# Patient Record
Sex: Female | Born: 1965 | Race: Black or African American | Hispanic: No | Marital: Married | Smoking: Never smoker
Health system: Southern US, Community
[De-identification: ages and names within clinical notes are randomized; demographics above are authoritative.]

## PROBLEM LIST (undated history)

## (undated) ENCOUNTER — Emergency Department (HOSPITAL_BASED_OUTPATIENT_CLINIC_OR_DEPARTMENT_OTHER): Admission: EM | Payer: Managed Care, Other (non HMO) | Source: Home / Self Care

## (undated) ENCOUNTER — Emergency Department (HOSPITAL_COMMUNITY): Admission: EM | Payer: Managed Care, Other (non HMO) | Source: Home / Self Care

## (undated) DIAGNOSIS — F419 Anxiety disorder, unspecified: Secondary | ICD-10-CM

## (undated) DIAGNOSIS — K219 Gastro-esophageal reflux disease without esophagitis: Secondary | ICD-10-CM

## (undated) DIAGNOSIS — I10 Essential (primary) hypertension: Secondary | ICD-10-CM

## (undated) DIAGNOSIS — E669 Obesity, unspecified: Secondary | ICD-10-CM

## (undated) HISTORY — PX: CHOLECYSTECTOMY: SHX55

## (undated) HISTORY — PX: OVARY SURGERY: SHX727

## (undated) HISTORY — PX: APPENDECTOMY: SHX54

---

## 2011-05-02 ENCOUNTER — Emergency Department (HOSPITAL_BASED_OUTPATIENT_CLINIC_OR_DEPARTMENT_OTHER)
Admission: EM | Admit: 2011-05-02 | Discharge: 2011-05-02 | Disposition: A | Discharge status: Home / Self Care | Payer: 59 | Attending: Emergency Medicine | Admitting: Emergency Medicine

## 2011-05-02 ENCOUNTER — Other Ambulatory Visit: Payer: Self-pay

## 2011-05-02 ENCOUNTER — Encounter: Payer: Self-pay | Admitting: *Deleted

## 2011-05-02 DIAGNOSIS — R002 Palpitations: Secondary | ICD-10-CM

## 2011-05-02 LAB — CBC
MCH: 27.9 pg (ref 26.0–34.0)
MCV: 84.7 fL (ref 78.0–100.0)
Platelets: 241 10*3/uL (ref 150–400)
RDW: 13 % (ref 11.5–15.5)

## 2011-05-02 LAB — DIFFERENTIAL
Basophils Absolute: 0 10*3/uL (ref 0.0–0.1)
Eosinophils Absolute: 0.2 10*3/uL (ref 0.0–0.7)
Eosinophils Relative: 3 % (ref 0–5)
Lymphs Abs: 1.8 10*3/uL (ref 0.7–4.0)

## 2011-05-02 LAB — BASIC METABOLIC PANEL
BUN: 11 mg/dL (ref 6–23)
Creatinine, Ser: 0.7 mg/dL (ref 0.50–1.10)
GFR calc Af Amer: >90 mL/min (ref >90)
GFR calc non Af Amer: >90 mL/min (ref >90)

## 2011-05-02 NOTE — ED Provider Notes (Signed)
History     CSN: [id number] Arrival date & time: 05/02/2011  1:00 AM   First MD Initiated Contact with Patient 05/02/11 0153      Chief Complaint  Patient presents with  . Irregular Heart Beat    (Consider location/radiation/quality/duration/timing/severity/associated sxs/prior treatment) HPI This is a 45 year old black female with a history of palpitations beginning about 9 PM yesterday. She describes them as skipped beats which last only a second or 2. They seem to be worse when she's lying down. She denies chest pain, dyspnea, nausea, vomiting or diaphoresis. She states she does have a thick feeling in her throat when they happen. She has no history of thyroid disease.  History reviewed. No pertinent past medical history.  Past Surgical History  Procedure Date  . Cholecystectomy   . Appendectomy   . Ovary surgery     History reviewed. No pertinent family history.  History  Substance Use Topics  . Smoking status: Never Smoker   . Smokeless tobacco: Not on file  . Alcohol Use: No    OB History    Grav Para Term Preterm Abortions TAB SAB Ect Mult Living                  Review of Systems  All other systems reviewed and are negative.    Allergies  Demerol; Penicillins; and Toradol  Home Medications   Current Outpatient Rx  Name Route Sig Dispense Refill  . CLONAZEPAM 1 MG PO TABS Oral Take 1 mg by mouth 2 (two) times daily as needed.      . OMEGA-3 FATTY ACIDS 1000 MG PO CAPS Oral Take 2 g by mouth daily.        BP 126/79  Pulse 66  Temp(Src) 98 F (36.7 C) (Oral)  Resp 17  Ht 5\' 8"  (1.727 m)  Wt 280 lb (127.007 kg)  BMI 42.57 kg/m2  SpO2 100%  Physical Exam General: Well-developed, well-nourished female in no acute distress; appearance consistent with age of record HENT: normocephalic, atraumatic Eyes: Normal appearance Neck: supple; no thyromegaly or thyroid tenderness Heart: regular rate and rhythm; no murmurs, rubs or gallops Lungs: clear to  auscultation bilaterally Abdomen: soft; nontender; nondistended; no masses or hepatosplenomegaly; bowel sounds present Extremities: No deformity; full range of motion Neurologic: Awake, alert and oriented;motor function intact in all extremities and symmetric; no facial droop Skin: Warm and dry Psychiatric: Anxious    ED Course  Procedures (including critical care time)    MDM   EKG Interpretation:  Date & Time: 05/02/2011 1:06 AM  Rate: 69  Rhythm: normal sinus rhythm  QRS Axis: normal  Intervals: normal  ST/T Wave abnormalities: normal  Conduction Disutrbances:none  Narrative Interpretation:   Old EKG Reviewed: none available  Nursing notes and vitals signs, including pulse oximetry, reviewed.  Summary of this visit's results, reviewed by myself:  Labs:  Results for orders placed during the hospital encounter of 05/02/11  BASIC METABOLIC PANEL      Component Value Range   Sodium 139  135 - 145 (mEq/L)   Potassium 3.4 (*) 3.5 - 5.1 (mEq/L)   Chloride 103  96 - 112 (mEq/L)   CO2 26  19 - 32 (mEq/L)   Glucose, Bld 98  70 - 99 (mg/dL)   BUN 11  6 - 23 (mg/dL)   Creatinine, Ser 7.84  0.50 - 1.10 (mg/dL)   Calcium 9.4  8.4 - 69.6 (mg/dL)   GFR calc non Af Amer >90  >90 (  mL/min)   GFR calc Af Amer >90  >90 (mL/min)  TROPONIN I      Component Value Range   Troponin I <0.30  <0.30 (ng/mL)  CBC      Component Value Range   WBC 5.4  4.0 - 10.5 (K/uL)   RBC 4.45  3.87 - 5.11 (MIL/uL)   Hemoglobin 12.4  12.0 - 15.0 (g/dL)   HCT 16.1  09.6 - 04.5 (%)   MCV 84.7  78.0 - 100.0 (fL)   MCH 27.9  26.0 - 34.0 (pg)   MCHC 32.9  30.0 - 36.0 (g/dL)   RDW 40.9  81.1 - 91.4 (%)   Platelets 241  150 - 400 (K/uL)  DIFFERENTIAL      Component Value Range   Neutrophils Relative 54  43 - 77 (%)   Neutro Abs 2.9  1.7 - 7.7 (K/uL)   Lymphocytes Relative 33  12 - 46 (%)   Lymphs Abs 1.8  0.7 - 4.0 (K/uL)   Monocytes Relative 10  3 - 12 (%)   Monocytes Absolute 0.5  0.1 - 1.0 (K/uL)    Eosinophils Relative 3  0 - 5 (%)   Eosinophils Absolute 0.2  0.0 - 0.7 (K/uL)   Basophils Relative 1  0 - 1 (%)   Basophils Absolute 0.0  0.0 - 0.1 (K/uL)   3:19 AM No arrhythmia or abnormal beats seen on review of monitor strip. Suspect patient is. Seen PVCs. She has a history of similar several years ago and wore a heart monitor for 3 weeks without a definitive diagnosis. We will refer to cardiology. A TSH is pending, and the flow manager at [facility] we'll follow this and contact the patient's TSH is abnormal.       Hanley Seamen, MD 05/02/11 0320

## 2011-05-02 NOTE — ED Notes (Signed)
C/o heart beating funny when laying down tonight, has had similar episodes through out the day, denies any N/V, no diaphoresis. Took klonopin around 11:30 without relief

## 2011-10-29 ENCOUNTER — Emergency Department (HOSPITAL_BASED_OUTPATIENT_CLINIC_OR_DEPARTMENT_OTHER)
Admission: EM | Admit: 2011-10-29 | Discharge: 2011-10-29 | Disposition: A | Discharge status: Home / Self Care | Payer: Managed Care, Other (non HMO) | Attending: Emergency Medicine | Admitting: Emergency Medicine

## 2011-10-29 ENCOUNTER — Encounter (HOSPITAL_BASED_OUTPATIENT_CLINIC_OR_DEPARTMENT_OTHER): Payer: Self-pay | Admitting: Emergency Medicine

## 2011-10-29 ENCOUNTER — Emergency Department (INDEPENDENT_AMBULATORY_CARE_PROVIDER_SITE_OTHER): Payer: Managed Care, Other (non HMO)

## 2011-10-29 DIAGNOSIS — R42 Dizziness and giddiness: Secondary | ICD-10-CM | POA: Insufficient documentation

## 2011-10-29 DIAGNOSIS — R079 Chest pain, unspecified: Secondary | ICD-10-CM

## 2011-10-29 DIAGNOSIS — E039 Hypothyroidism, unspecified: Secondary | ICD-10-CM | POA: Insufficient documentation

## 2011-10-29 DIAGNOSIS — R002 Palpitations: Secondary | ICD-10-CM | POA: Insufficient documentation

## 2011-10-29 LAB — DIFFERENTIAL
Basophils Absolute: 0 10*3/uL (ref 0.0–0.1)
Basophils Relative: 1 % (ref 0–1)
Eosinophils Relative: 3 % (ref 0–5)
Lymphocytes Relative: 35 % (ref 12–46)

## 2011-10-29 LAB — BASIC METABOLIC PANEL
CO2: 30 mEq/L (ref 19–32)
Calcium: 9.7 mg/dL (ref 8.4–10.5)
GFR calc Af Amer: 89 mL/min — ABNORMAL LOW (ref >90)
GFR calc non Af Amer: 77 mL/min — ABNORMAL LOW (ref >90)
Sodium: 142 mEq/L (ref 135–145)

## 2011-10-29 LAB — CBC
MCHC: 33.2 g/dL (ref 30.0–36.0)
MCV: 86.7 fL (ref 78.0–100.0)
Platelets: 240 10*3/uL (ref 150–400)
RDW: 13.2 % (ref 11.5–15.5)
WBC: 4.9 10*3/uL (ref 4.0–10.5)

## 2011-10-29 LAB — D-DIMER, QUANTITATIVE: D-Dimer, Quant: 0.28 ug/mL-FEU (ref 0.00–0.48)

## 2011-10-29 LAB — TROPONIN I: Troponin I: <0.3 ng/mL (ref <0.30)

## 2011-10-29 MED ORDER — ASPIRIN 81 MG PO CHEW
324.0000 mg | CHEWABLE_TABLET | Freq: Once | ORAL | Status: AC
  Administered 2011-10-29: 324 mg via ORAL
  Filled 2011-10-29: qty 1
  Filled 2011-10-29: qty 3

## 2011-10-29 NOTE — ED Notes (Signed)
States has had left sided chest pain intermittently for 2 days.  Has also had intermittent episodes of dizziness.  Denies any SOB, n/v.  Drank a detox shake this am.  Today has had some palpitations.

## 2011-10-29 NOTE — Discharge Instructions (Signed)
Chest Pain (Nonspecific) It is often hard to give a specific diagnosis for the cause of chest pain. There is always a chance that your pain could be related to something serious, such as a heart attack or a blood clot in the lungs. You need to follow up with your caregiver for further evaluation. CAUSES   Heartburn.   Pneumonia or bronchitis.   Anxiety or stress.   Inflammation around your heart (pericarditis) or lung (pleuritis or pleurisy).   A blood clot in the lung.   A collapsed lung (pneumothorax). It can develop suddenly on its own (spontaneous pneumothorax) or from injury (trauma) to the chest.   Shingles infection (herpes zoster virus).  The chest wall is composed of bones, muscles, and cartilage. Any of these can be the source of the pain.  The bones can be bruised by injury.   The muscles or cartilage can be strained by coughing or overwork.   The cartilage can be affected by inflammation and become sore (costochondritis).  DIAGNOSIS  Lab tests or other studies, such as X-rays, electrocardiography, stress testing, or cardiac imaging, may be needed to find the cause of your pain.  TREATMENT   Treatment depends on what may be causing your chest pain. Treatment may include:   Acid blockers for heartburn.   Anti-inflammatory medicine.   Pain medicine for inflammatory conditions.   Antibiotics if an infection is present.   You may be advised to change lifestyle habits. This includes stopping smoking and avoiding alcohol, caffeine, and chocolate.   You may be advised to keep your head raised (elevated) when sleeping. This reduces the chance of acid going backward from your stomach into your esophagus.   Most of the time, nonspecific chest pain will improve within 2 to 3 days with rest and mild pain medicine.  HOME CARE INSTRUCTIONS   If antibiotics were prescribed, take your antibiotics as directed. Finish them even if you start to feel better.   For the next few  days, avoid physical activities that bring on chest pain. Continue physical activities as directed.   Do not smoke.   Avoid drinking alcohol.   Only take over-the-counter or prescription medicine for pain, discomfort, or fever as directed by your caregiver.   Follow your caregiver's suggestions for further testing if your chest pain does not go away.   Keep any follow-up appointments you made. If you do not go to an appointment, you could develop lasting (chronic) problems with pain. If there is any problem keeping an appointment, you must call to reschedule.  SEEK MEDICAL CARE IF:   You think you are having problems from the medicine you are taking. Read your medicine instructions carefully.   Your chest pain does not go away, even after treatment.   You develop a rash with blisters on your chest.  SEEK IMMEDIATE MEDICAL CARE IF:   You have increased chest pain or pain that spreads to your arm, neck, jaw, back, or abdomen.   You develop shortness of breath, an increasing cough, or you are coughing up blood.   You have severe back or abdominal pain, feel nauseous, or vomit.   You develop severe weakness, fainting, or chills.   You have a fever.  THIS IS AN EMERGENCY. Do not wait to see if the pain will go away. Get medical help at once. Call your local emergency services (911 in [place] Do not drive yourself to the hospital. MAKE SURE YOU:   Understand these instructions.     Will watch your condition.   Will get help right away if you are not doing well or get worse.  Document Released: 03/26/2005 Document Revised: 06/05/2011 Document Reviewed: 01/20/2008 [facility] Patient Information 2012 [place], [place].  Palpitations  A palpitation is the feeling that your heartbeat is irregular or is faster than normal. Although this is frightening, it usually is not serious. Palpitations may be caused by excesses of smoking, caffeine, or alcohol. They are also brought on by stress and  anxiety. Sometimes, they are caused by heart disease. Unless otherwise noted, your caregiver did not find any signs of serious illness at this time. HOME CARE INSTRUCTIONS  To help prevent palpitations:  Drink decaffeinated coffee, tea, and soda pop. Avoid chocolate.   If you smoke or drink alcohol, quit or cut down as much as possible.   Reduce your stress or anxiety level. Biofeedback, yoga, or meditation will help you relax. Physical activity such as swimming, jogging, or walking also may be helpful.  SEEK MEDICAL CARE IF:   You continue to have a fast heartbeat.   Your palpitations occur more often.  SEEK IMMEDIATE MEDICAL CARE IF: You develop chest pain, shortness of breath, severe headache, dizziness, or fainting. Document Released: 06/13/2000 Document Revised: 06/05/2011 Document Reviewed: 08/13/2007 [facility] Patient Information 2012 [place], [place].  RESOURCE GUIDE  Dental Problems  Patients with Medicaid: [facility] [phone] [address]                                           [phone] [address] Phone:  [phone]                                                   Phone:  [phone]  If unable to pay or uninsured, contact:  [facility] or [facility]. to become qualified for the adult dental clinic.  Chronic Pain Problems Contact [facility]  [phone] Patients need to be referred by their primary care doctor.  Insufficient Money for Medicine Contact [employer]:  call "211" or [facility] [phone].  No Primary Care Doctor Call [facility]  [phone] Other agencies that provide inexpensive medical care    [facility]  [phone]    [facility]  [phone]    [facility]  [phone]    [facility]  [phone]    [facility]  [phone]    [facility]  [phone]  Psychological Services [facility]   [phone] [facility]  [phone] [facility]   [phone] (emergency services [phone])  Abuse/Neglect [facility] Child Abuse Hotline [phone] [facility] Child Abuse Hotline 618-184-2063 (After Hours)  Emergency Shelter [facility] 9157974522  [facility] [phone] [facility] [phone]  MRSA Hotline #:   7371634073    [facility] [place]  [facility] Dept. [address] [place]                     [address]         [address]  Blondell Reveal Phone:  [phone]                                  Phone:  [phone]                   Phone:  [phone]  [facility] Phone:  [phone]  [facility] Child Abuse Hotline [phone] 3853731761 (After Hours)

## 2011-10-29 NOTE — ED Notes (Signed)
Patient transported to X-ray 

## 2011-10-29 NOTE — ED Provider Notes (Addendum)
History     CSN: [id number]  Arrival date & time 10/29/11  1225   First MD Initiated Contact with Patient 10/29/11 1254      Chief Complaint  Patient presents with  . Chest Pain    (Consider location/radiation/quality/duration/timing/severity/associated sxs/prior treatment) HPI  45yoF h/o anxiety, hypothyroidism pw multiple complaints. She states that intermittently over the past 2 days she's experienced sharp left-sided chest pain with burning sensation in her left arm. There is no radiation otherwise. She states that the pain is under her left breast. It is not worse with movement or deep breathing. It is not worse with exertion or better with rest. She states that it is intermittent and there no exacerbating or relieving factors. +lightheadedness as well. She states she is also having intermittent palpitations or "skipped beats" and that there is no correlation btw the palpitations and chest pain. She has not taken anything for pain prior to arrival. She denies pain at this time. She states that each episode lasts for seconds to minutes and resolve spontaneously. Denies fever/chills/cough. No trauma. Denies h/o VTE in self or family. No recent hosp/surg/immob. No h/o cancer. Denies exogenous hormone use, no leg pain or swelling. Holter monitor was worn for 30 days in Oct 2012 for similar sx palpitations.  Denies h/o HTN, HLD, DM, fmhx CAD. Former smoker   ED Notes, ED Provider Notes from 10/29/11 0000 to 10/29/11 13:11:06       Meriel Flavors 10/29/2011 13:04      States has had left sided chest pain intermittently for 2 days. Has also had intermittent episodes of dizziness. Denies any SOB, n/v. Drank a detox shake this am. Today has had some palpitations.    Past Medical History  Diagnosis Date  . Thyroid disease     Past Surgical History  Procedure Date  . Cholecystectomy   . Appendectomy   . Ovary surgery     No family history on file.  History  Substance Use Topics  .  Smoking status: Never Smoker   . Smokeless tobacco: Not on file  . Alcohol Use: No    OB History    Grav Para Term Preterm Abortions TAB SAB Ect Mult Living                  Review of Systems  All other systems reviewed and are negative.   except as noted HPI  Allergies  Demerol; Ketorolac tromethamine; and Penicillins  Home Medications   Current Outpatient Rx  Name Route Sig Dispense Refill  . THYROID 60 MG PO TABS Oral Take 60 mg by mouth daily.    Marland Kitchen CLONAZEPAM 1 MG PO TABS Oral Take 1 mg by mouth 2 (two) times daily as needed.      . OMEGA-3 FATTY ACIDS 1000 MG PO CAPS Oral Take 2 g by mouth daily.        BP 120/73  Pulse 60  Temp(Src) 97.7 F (36.5 C) (Oral)  Resp 16  Ht 5\' 9"  (1.753 m)  Wt 280 lb (127.007 kg)  BMI 41.35 kg/m2  SpO2 99%  LMP 08/29/2011  Physical Exam  Nursing note and vitals reviewed. Constitutional: She is oriented to person, place, and time. She appears well-developed.  HENT:  Head: Atraumatic.  Mouth/Throat: Oropharynx is clear and moist.  Eyes: Conjunctivae and EOM are normal. Pupils are equal, round, and reactive to light.  Neck: Normal range of motion. Neck supple.  Cardiovascular: Normal rate, regular rhythm, normal heart sounds  and intact distal pulses.   Pulmonary/Chest: Effort normal and breath sounds normal. No respiratory distress. She has no wheezes. She has no rales.  Abdominal: Soft. She exhibits no distension. There is no tenderness. There is no rebound and no guarding.  Musculoskeletal: Normal range of motion.  Neurological: She is alert and oriented to person, place, and time.  Skin: Skin is warm and dry. No rash noted.  Psychiatric: She has a normal mood and affect.    Date: 10/29/2011  Rate: 77  Rhythm: normal sinus rhythm  QRS Axis: normal  Intervals: normal  ST/T Wave abnormalities: normal  Conduction Disutrbances:none  Narrative Interpretation:   Old EKG Reviewed: unchanged  ED Course  Procedures (including  critical care time)  Labs Reviewed  BASIC METABOLIC PANEL - Abnormal; Notable for the following:    Glucose, Bld 100 (*)    GFR calc non Af Amer 77 (*)    GFR calc Af Amer 89 (*)    All other components within normal limits  CBC  DIFFERENTIAL  TROPONIN I  D-DIMER, QUANTITATIVE  PREGNANCY, URINE  URINALYSIS, ROUTINE W REFLEX MICROSCOPIC  TROPONIN I   Dg Chest 2 View  10/29/2011  *RADIOLOGY REPORT*  Clinical Data: Chest pain  CHEST - 2 VIEW  Comparison: None  Findings: The heart size and mediastinal contours are within normal limits.  Both lungs are clear.  The visualized skeletal structures are unremarkable.  IMPRESSION: Negative exam  Original Report Authenticated By: Rosealee Albee, M.D.     No diagnosis found.   MDM  Former smoker, h/o anxiety pw chest pain and palpitations. Currently the emergency department. Her symptoms have been intermittent over the past 2 days or so. She states that she thinks it might be secondary to anxiety but she never had similar symptoms with her anxiety. She has had palpitations in the past and wore Holter monitor for 30 days in October 2012 without any adverse events. She's lowers for pulmonary embolism and her d-dimer today is negative. She is also low risk for acute coronary syndrome her only risk factor having been a former smoker. She is taking metformin in the past but it was to regulate her menstrual cycles and she does not have a history of diabetes. She has a TIMI score 0. Her EKG is nonischemic in nature. Her troponin is negative. She will have a repeat troponin. I discussed followup with her primary care Dr. for several reasons. One would be for a stress test. The second is to have her TSH checked as she is not done so since January. At this time she is not displaying any other symptoms of hyperthyroidism. If troponin is negative she will be discharged home to follow with her primary care physician.     Forbes Cellar, MD 10/29/11  1531   Repeat troponin negative. Continues to deny pain. Plan as above.  Forbes Cellar, MD 10/29/11 [phone]

## 2013-05-21 ENCOUNTER — Emergency Department (HOSPITAL_BASED_OUTPATIENT_CLINIC_OR_DEPARTMENT_OTHER): Payer: Managed Care, Other (non HMO)

## 2013-05-21 ENCOUNTER — Emergency Department (HOSPITAL_BASED_OUTPATIENT_CLINIC_OR_DEPARTMENT_OTHER)
Admission: EM | Admit: 2013-05-21 | Discharge: 2013-05-21 | Disposition: A | Discharge status: Home / Self Care | Payer: Managed Care, Other (non HMO) | Attending: Emergency Medicine | Admitting: Emergency Medicine

## 2013-05-21 ENCOUNTER — Encounter (HOSPITAL_BASED_OUTPATIENT_CLINIC_OR_DEPARTMENT_OTHER): Payer: Self-pay | Admitting: Emergency Medicine

## 2013-05-21 DIAGNOSIS — Z79899 Other long term (current) drug therapy: Secondary | ICD-10-CM | POA: Insufficient documentation

## 2013-05-21 DIAGNOSIS — K5900 Constipation, unspecified: Secondary | ICD-10-CM | POA: Insufficient documentation

## 2013-05-21 DIAGNOSIS — Z9889 Other specified postprocedural states: Secondary | ICD-10-CM | POA: Insufficient documentation

## 2013-05-21 DIAGNOSIS — E079 Disorder of thyroid, unspecified: Secondary | ICD-10-CM | POA: Insufficient documentation

## 2013-05-21 DIAGNOSIS — Z880 Allergy status to penicillin: Secondary | ICD-10-CM | POA: Insufficient documentation

## 2013-05-21 DIAGNOSIS — K219 Gastro-esophageal reflux disease without esophagitis: Secondary | ICD-10-CM | POA: Insufficient documentation

## 2013-05-21 DIAGNOSIS — Z9089 Acquired absence of other organs: Secondary | ICD-10-CM | POA: Insufficient documentation

## 2013-05-21 LAB — URINE MICROSCOPIC-ADD ON

## 2013-05-21 LAB — COMPREHENSIVE METABOLIC PANEL
ALT: 18 U/L (ref 0–35)
AST: 15 U/L (ref 0–37)
Calcium: 9.4 mg/dL (ref 8.4–10.5)
Creatinine, Ser: 0.9 mg/dL (ref 0.50–1.10)
GFR calc Af Amer: 88 mL/min — ABNORMAL LOW (ref >90)
GFR calc non Af Amer: 76 mL/min — ABNORMAL LOW (ref >90)
Glucose, Bld: 116 mg/dL — ABNORMAL HIGH (ref 70–99)
Sodium: 138 mEq/L (ref 135–145)
Total Protein: 7.9 g/dL (ref 6.0–8.3)

## 2013-05-21 LAB — CBC WITH DIFFERENTIAL/PLATELET
Basophils Absolute: 0 10*3/uL (ref 0.0–0.1)
Eosinophils Absolute: 0.1 10*3/uL (ref 0.0–0.7)
Eosinophils Relative: 1 % (ref 0–5)
HCT: 40 % (ref 36.0–46.0)
MCH: 27.6 pg (ref 26.0–34.0)
MCV: 86.4 fL (ref 78.0–100.0)
Monocytes Absolute: 0.4 10*3/uL (ref 0.1–1.0)
Platelets: 219 10*3/uL (ref 150–400)
RDW: 13.4 % (ref 11.5–15.5)

## 2013-05-21 LAB — URINALYSIS, ROUTINE W REFLEX MICROSCOPIC
Bilirubin Urine: NEGATIVE
Glucose, UA: NEGATIVE mg/dL
Ketones, ur: NEGATIVE mg/dL
Protein, ur: NEGATIVE mg/dL
Urobilinogen, UA: 1 mg/dL (ref 0.0–1.0)

## 2013-05-21 MED ORDER — DICYCLOMINE HCL 10 MG/ML IM SOLN
20.0000 mg | Freq: Once | INTRAMUSCULAR | Status: AC
  Administered 2013-05-21: 20 mg via INTRAMUSCULAR
  Filled 2013-05-21: qty 2

## 2013-05-21 MED ORDER — GI COCKTAIL ~~LOC~~
30.0000 mL | Freq: Once | ORAL | Status: AC
  Administered 2013-05-21: 30 mL via ORAL
  Filled 2013-05-21: qty 30

## 2013-05-21 MED ORDER — POLYETHYLENE GLYCOL 3350 17 GM/SCOOP PO POWD: 1.0000 | Freq: Once | ORAL | Status: DC

## 2013-05-21 MED ORDER — OMEPRAZOLE 20 MG PO CPDR: 20.0000 mg | DELAYED_RELEASE_CAPSULE | Freq: Every day | ORAL | Status: DC

## 2013-05-21 NOTE — ED Notes (Signed)
Patient transported to X-ray 

## 2013-05-21 NOTE — ED Notes (Signed)
Pt reports eating large kale dinner from whole [employer], had significant amount of gas after dinner, took otc all natural gas medication called "calm mag" which intesified pain, pt feels bloated but is unable to move gas pains

## 2013-05-21 NOTE — ED Notes (Signed)
Pt reports eating kale salad last pm along with some other whole "natural foods" last pm, awoke this am with severe luq pain

## 2013-05-21 NOTE — ED Provider Notes (Signed)
CSN: [id number]     Arrival date & time 05/21/13  0559 History   None    Chief Complaint  Patient presents with  . Abdominal Pain   (Consider location/radiation/quality/duration/timing/severity/associated sxs/prior Treatment) Patient is a 47 y.o. female presenting with abdominal pain. The history is provided by the patient.  Abdominal Pain Pain location:  LUQ Pain quality: dull   Pain radiates to:  Does not radiate Pain severity:  Moderate Onset quality:  Sudden Timing:  Constant Progression:  Unchanged Chronicity:  New Context: eating   Context comment:  Kale and tuna salads with cranberry dressing from whole foods Relieved by: laying on the stomach. Worsened by:  Nothing tried Associated symptoms: no constipation, no diarrhea, no fever, no flatus, no nausea, no vaginal bleeding and no vomiting   Risk factors: multiple surgeries     Past Medical History  Diagnosis Date  . Thyroid disease    Past Surgical History  Procedure Laterality Date  . Cholecystectomy    . Appendectomy    . Ovary surgery     History reviewed. No pertinent family history. History  Substance Use Topics  . Smoking status: Never Smoker   . Smokeless tobacco: Not on file  . Alcohol Use: No   OB History   Grav Para Term Preterm Abortions TAB SAB Ect Mult Living                 Review of Systems  Constitutional: Negative for fever.  Gastrointestinal: Positive for abdominal pain. Negative for nausea, vomiting, diarrhea, constipation and flatus.  Genitourinary: Negative for vaginal bleeding.  All other systems reviewed and are negative.    Allergies  Demerol; Ketorolac tromethamine; and Penicillins  Home Medications   Current Outpatient Rx  Name  Route  Sig  Dispense  Refill  . clonazePAM (KLONOPIN) 1 MG tablet   Oral   Take 1 mg by mouth 2 (two) times daily as needed.           . fish oil-omega-3 fatty acids 1000 MG capsule   Oral   Take 2 g by mouth daily.           Marland Kitchen  thyroid (ARMOUR) 60 MG tablet   Oral   Take 60 mg by mouth daily.          BP 148/72  Pulse 79  Temp(Src) 98.1 F (36.7 C) (Oral)  Resp 18  SpO2 100%  LMP 08/29/2011 Physical Exam  Constitutional: She is oriented to person, place, and time. She appears well-developed and well-nourished. No distress.  Well appearing and comfortable  HENT:  Head: Normocephalic and atraumatic.  Mouth/Throat: Oropharynx is clear and moist.  Eyes: Conjunctivae are normal. Pupils are equal, round, and reactive to light.  Neck: Normal range of motion. Neck supple.  Cardiovascular: Normal rate, regular rhythm and intact distal pulses.   Pulmonary/Chest: Effort normal and breath sounds normal. She has no wheezes. She has no rales.  Abdominal: Soft. Bowel sounds are increased. There is no tenderness. There is no rigidity, no rebound, no guarding and negative Murphy's sign.  Musculoskeletal: Normal range of motion.  Neurological: She is alert and oriented to person, place, and time.  Skin: Skin is warm.  Psychiatric: She has a normal mood and affect.    ED Course  Procedures (including critical care time) Labs Review Labs Reviewed  URINALYSIS, ROUTINE W REFLEX MICROSCOPIC  CBC WITH DIFFERENTIAL  COMPREHENSIVE METABOLIC PANEL   Imaging Review No results found.  EKG Interpretation  None       MDM  No diagnosis found. Constipation and gas from food and then the medication she took.  Will prescribe prilosec and miralax   Fiora Weill K Keryn Nessler-Rasch, MD 05/21/13 [phone]

## 2013-10-05 ENCOUNTER — Other Ambulatory Visit: Payer: Self-pay | Admitting: Orthopedic Surgery

## 2013-10-05 DIAGNOSIS — M545 Low back pain, unspecified: Secondary | ICD-10-CM

## 2013-10-16 ENCOUNTER — Ambulatory Visit
Admission: RE | Admit: 2013-10-16 | Discharge: 2013-10-16 | Disposition: A | Discharge status: Home / Self Care | Payer: Managed Care, Other (non HMO) | Source: Ambulatory Visit | Attending: Orthopedic Surgery | Admitting: Orthopedic Surgery

## 2013-10-16 DIAGNOSIS — M545 Low back pain, unspecified: Secondary | ICD-10-CM

## 2013-12-15 ENCOUNTER — Other Ambulatory Visit: Payer: Self-pay | Admitting: Specialist

## 2013-12-15 DIAGNOSIS — R102 Pelvic and perineal pain: Secondary | ICD-10-CM

## 2013-12-20 ENCOUNTER — Ambulatory Visit
Admission: RE | Admit: 2013-12-20 | Discharge: 2013-12-20 | Disposition: A | Discharge status: Home / Self Care | Payer: 59 | Source: Ambulatory Visit | Attending: Specialist | Admitting: Specialist

## 2013-12-20 ENCOUNTER — Ambulatory Visit
Admission: RE | Admit: 2013-12-20 | Discharge: 2013-12-20 | Disposition: A | Discharge status: Home / Self Care | Payer: Managed Care, Other (non HMO) | Source: Ambulatory Visit | Attending: Specialist | Admitting: Specialist

## 2013-12-20 DIAGNOSIS — R102 Pelvic and perineal pain: Secondary | ICD-10-CM

## 2014-03-20 ENCOUNTER — Other Ambulatory Visit: Payer: Self-pay | Admitting: Family Medicine

## 2014-03-20 DIAGNOSIS — R221 Localized swelling, mass and lump, neck: Secondary | ICD-10-CM

## 2014-03-23 ENCOUNTER — Ambulatory Visit
Admission: RE | Admit: 2014-03-23 | Discharge: 2014-03-23 | Disposition: A | Discharge status: Home / Self Care | Payer: 59 | Source: Ambulatory Visit | Attending: Family Medicine | Admitting: Family Medicine

## 2014-03-23 ENCOUNTER — Other Ambulatory Visit: Payer: Self-pay

## 2014-03-23 DIAGNOSIS — R221 Localized swelling, mass and lump, neck: Secondary | ICD-10-CM

## 2014-05-27 ENCOUNTER — Emergency Department (HOSPITAL_BASED_OUTPATIENT_CLINIC_OR_DEPARTMENT_OTHER): Payer: Managed Care, Other (non HMO)

## 2014-05-27 ENCOUNTER — Encounter (HOSPITAL_BASED_OUTPATIENT_CLINIC_OR_DEPARTMENT_OTHER): Payer: Self-pay | Admitting: *Deleted

## 2014-05-27 ENCOUNTER — Emergency Department (HOSPITAL_BASED_OUTPATIENT_CLINIC_OR_DEPARTMENT_OTHER)
Admission: EM | Admit: 2014-05-27 | Discharge: 2014-05-27 | Disposition: A | Discharge status: Home / Self Care | Payer: Managed Care, Other (non HMO) | Attending: Emergency Medicine | Admitting: Emergency Medicine

## 2014-05-27 DIAGNOSIS — M25562 Pain in left knee: Secondary | ICD-10-CM

## 2014-05-27 DIAGNOSIS — X58XXXA Exposure to other specified factors, initial encounter: Secondary | ICD-10-CM | POA: Diagnosis not present

## 2014-05-27 DIAGNOSIS — S8992XA Unspecified injury of left lower leg, initial encounter: Secondary | ICD-10-CM | POA: Diagnosis not present

## 2014-05-27 DIAGNOSIS — Y9289 Other specified places as the place of occurrence of the external cause: Secondary | ICD-10-CM | POA: Diagnosis not present

## 2014-05-27 DIAGNOSIS — Y998 Other external cause status: Secondary | ICD-10-CM | POA: Diagnosis not present

## 2014-05-27 DIAGNOSIS — Y9389 Activity, other specified: Secondary | ICD-10-CM | POA: Diagnosis not present

## 2014-05-27 DIAGNOSIS — Z880 Allergy status to penicillin: Secondary | ICD-10-CM | POA: Diagnosis not present

## 2014-05-27 DIAGNOSIS — Z79899 Other long term (current) drug therapy: Secondary | ICD-10-CM | POA: Insufficient documentation

## 2014-05-27 DIAGNOSIS — R52 Pain, unspecified: Secondary | ICD-10-CM

## 2014-05-27 MED ORDER — HYDROCODONE-ACETAMINOPHEN 5-325 MG PO TABS
1.0000 | ORAL_TABLET | Freq: Once | ORAL | Status: AC
  Administered 2014-05-27: 1 via ORAL
  Filled 2014-05-27: qty 1

## 2014-05-27 MED ORDER — NAPROXEN 250 MG PO TABS
500.0000 mg | ORAL_TABLET | Freq: Once | ORAL | Status: AC
  Administered 2014-05-27: 500 mg via ORAL
  Filled 2014-05-27: qty 2

## 2014-05-27 MED ORDER — NAPROXEN 500 MG PO TABS: 500.0000 mg | ORAL_TABLET | Freq: Two times a day (BID) | ORAL | Status: DC

## 2014-05-27 MED ORDER — HYDROCODONE-ACETAMINOPHEN 5-325 MG PO TABS
1.0000 | ORAL_TABLET | ORAL | Status: DC | PRN
Start: 2014-05-27 — End: 2016-09-10

## 2014-05-27 NOTE — Discharge Instructions (Signed)
Please follow the directions provided.  Be sure to follow-up with your orthopedic doctor for further management of your knee pain.  Please take the naproxen twice a day for inflammation and you may take the vicodin for pain not relieved by the naproxen.  Use your splint and crutches as needed.  Don't hesitate to return for any new, worsening or concerning symptoms.      SEEK IMMEDIATE MEDICAL CARE IF:  Your knee seems to be getting worse rather than better.  You have increasing pain or swelling in the knee.  You have problems caused by the knee brace.  You have increased swelling or inflammation (redness or soreness) in your knee.  Your knee becomes warm and more painful and you develop an unexplained temperature over 101F (38.3C).

## 2014-05-27 NOTE — ED Provider Notes (Signed)
CSN: [id number]     Arrival date & time 05/27/14  1744 History   First MD Initiated Contact with Patient 05/27/14 1922     Chief Complaint  Patient presents with  . Knee Injury    (Consider location/radiation/quality/duration/timing/severity/associated sxs/prior Treatment) HPI  Katrina Nelson is a 48 yo female presenting with knee pain She reports this evening she was getting into the car and felt sharp pain in her left knee. She was able to bear weigh on it, but it was painful and needed her family's help getting to the emergency department.  She rates the pain as 8/10.  She denies any altered sensation or noticeable deformity in the affected knee.    History reviewed. No pertinent past medical history. Past Surgical History  Procedure Laterality Date  . Cholecystectomy    . Appendectomy    . Ovary surgery     No family history on file. History  Substance Use Topics  . Smoking status: Never Smoker   . Smokeless tobacco: Not on file  . Alcohol Use: No   OB History    No data available     Review of Systems  Constitutional: Negative for fever.  Cardiovascular: Negative for leg swelling.  Musculoskeletal: Positive for myalgias and arthralgias.  Skin: Negative for rash.      Allergies  Demerol; Ketorolac tromethamine; and Penicillins  Home Medications   Prior to Admission medications   Medication Sig Start Date End Date Taking? Authorizing Provider  Ascorbic Acid (VITAMIN C) 100 MG tablet Take 100 mg by mouth daily.    Historical Provider, MD  cholecalciferol (VITAMIN D) 1000 UNITS tablet Take 1,000 Units by mouth daily.    Historical Provider, MD  clonazePAM (KLONOPIN) 1 MG tablet Take 1 mg by mouth 2 (two) times daily as needed.      Historical Provider, MD  fish oil-omega-3 fatty acids 1000 MG capsule Take 2 g by mouth daily.      Historical Provider, MD  NON FORMULARY Calm magnesium    Historical Provider, MD  omeprazole (PRILOSEC) 20 MG capsule Take 1 capsule  (20 mg total) by mouth daily. 05/21/13   Katrina K Palumbo-Rasch, MD  polyethylene glycol powder (MIRALAX) powder Take 255 g (1 Container total) by mouth once. 05/21/13   Katrina K Palumbo-Rasch, MD  thyroid (ARMOUR) 60 MG tablet Take 60 mg by mouth daily.    Historical Provider, MD   BP 134/84 mmHg  Pulse 73  Temp(Src) 98.4 F (36.9 C) (Oral)  Resp 18  Ht 5\' 9"  (1.753 m)  Wt 290 lb (131.543 kg)  BMI 42.81 kg/m2  SpO2 100%  LMP 08/29/2011 Physical Exam  Musculoskeletal: She exhibits tenderness.       Left knee: She exhibits no swelling, no effusion, no deformity and normal alignment. Tenderness found.       Legs: Nursing note and vitals reviewed.   ED Course  Procedures (including critical care time) Labs Review Labs Reviewed - No data to display  Imaging Review  EXAM: LEFT KNEE - COMPLETE 4+ VIEW  COMPARISON: None.  FINDINGS: There is no evidence of fracture, dislocation, or joint effusion. No significant joint space narrowing is noted. However, mild osteophyte formation is noted medially and laterally. Soft tissues are unremarkable.  IMPRESSION: Mild degenerative changes as described above. No acute abnormality seen in the left knee.   EKG Interpretation None      MDM   Final diagnoses:  Pain  Left knee pain   48 yo  with knee pain since injury this evening. Her X-Ray negative for obvious fracture or dislocation. Pain managed in ED. Pt advised to follow up with orthopedics if symptoms persist. Patient given brace while in ED, conservative therapy recommended and discussed. Pt is well-appearing, in no acute distress and vital signs are stable.  They appear safe to be discharged.  Discharge include follow-up with their PCP.  Return precautions provided.  Patient is agreeable with above plan.   Filed Vitals:   05/27/14 1806  BP: 134/84  Pulse: 73  Temp: 98.4 F (36.9 C)  TempSrc: Oral  Resp: 18  Height: 5\' 9"  (1.753 m)  Weight: 290 lb (131.543 kg)  SpO2:  100%   Meds given in ED:  Medications  naproxen (NAPROSYN) tablet 500 mg (500 mg Oral Given 05/27/14 1947)  HYDROcodone-acetaminophen (NORCO/VICODIN) 5-325 MG per tablet 1 tablet (1 tablet Oral Given 05/27/14 1947)    Discharge Medication List as of 05/27/2014  7:45 PM    START taking these medications   Details  HYDROcodone-acetaminophen (NORCO/VICODIN) 5-325 MG per tablet Take 1-2 tablets by mouth every 4 (four) hours as needed for moderate pain or severe pain., Starting 05/27/2014, Until Discontinued, Print    naproxen (NAPROSYN) 500 MG tablet Take 1 tablet (500 mg total) by mouth 2 (two) times daily with a meal., Starting 05/27/2014, Until Discontinued, Print           Katrina BattiestElizabeth Nicki Gracy, NP 05/30/14 0400  Katrina FossaElizabeth Rees, MD 05/30/14 [phone]

## 2014-05-27 NOTE — ED Notes (Signed)
States that she was getting into a car and hear a pop in her left knee and now is unable to bear weight or straighten her leg

## 2016-05-20 ENCOUNTER — Emergency Department (HOSPITAL_COMMUNITY)
Admission: EM | Admit: 2016-05-20 | Discharge: 2016-05-20 | Disposition: A | Discharge status: Home / Self Care | Payer: Managed Care, Other (non HMO) | Attending: Emergency Medicine | Admitting: Emergency Medicine

## 2016-05-20 ENCOUNTER — Encounter (HOSPITAL_COMMUNITY): Payer: Self-pay

## 2016-05-20 DIAGNOSIS — R002 Palpitations: Secondary | ICD-10-CM | POA: Diagnosis not present

## 2016-05-20 DIAGNOSIS — E876 Hypokalemia: Secondary | ICD-10-CM

## 2016-05-20 DIAGNOSIS — F410 Panic disorder [episodic paroxysmal anxiety] without agoraphobia: Secondary | ICD-10-CM

## 2016-05-20 DIAGNOSIS — F419 Anxiety disorder, unspecified: Secondary | ICD-10-CM | POA: Insufficient documentation

## 2016-05-20 DIAGNOSIS — Z79899 Other long term (current) drug therapy: Secondary | ICD-10-CM | POA: Diagnosis not present

## 2016-05-20 HISTORY — DX: Anxiety disorder, unspecified: F41.9

## 2016-05-20 LAB — COMPREHENSIVE METABOLIC PANEL
ALBUMIN: 4.3 g/dL (ref 3.5–5.0)
ALT: 18 U/L (ref 14–54)
ANION GAP: 8 (ref 5–15)
AST: 18 U/L (ref 15–41)
Alkaline Phosphatase: 84 U/L (ref 38–126)
BILIRUBIN TOTAL: 1.3 mg/dL — AB (ref 0.3–1.2)
BUN: 10 mg/dL (ref 6–20)
CO2: 26 mmol/L (ref 22–32)
Calcium: 9 mg/dL (ref 8.9–10.3)
Chloride: 103 mmol/L (ref 101–111)
Creatinine, Ser: 1.04 mg/dL — ABNORMAL HIGH (ref 0.44–1.00)
GFR calc non Af Amer: >60 mL/min (ref >60)
GLUCOSE: 111 mg/dL — AB (ref 65–99)
Potassium: 3.3 mmol/L — ABNORMAL LOW (ref 3.5–5.1)
Sodium: 137 mmol/L (ref 135–145)
Total Protein: 8.1 g/dL (ref 6.5–8.1)

## 2016-05-20 LAB — CBC WITH DIFFERENTIAL/PLATELET
BASOS ABS: 0.1 10*3/uL (ref 0.0–0.1)
BASOS PCT: 1 %
EOS ABS: 0.1 10*3/uL (ref 0.0–0.7)
Eosinophils Relative: 2 %
HEMATOCRIT: 40.5 % (ref 36.0–46.0)
Hemoglobin: 13.2 g/dL (ref 12.0–15.0)
Lymphocytes Relative: 24 %
Lymphs Abs: 1.4 10*3/uL (ref 0.7–4.0)
MCH: 28.2 pg (ref 26.0–34.0)
MCHC: 32.6 g/dL (ref 30.0–36.0)
MCV: 86.5 fL (ref 78.0–100.0)
Monocytes Absolute: 0.4 10*3/uL (ref 0.1–1.0)
Monocytes Relative: 6 %
Neutro Abs: 4 10*3/uL (ref 1.7–7.7)
Neutrophils Relative %: 67 %
Platelets: 261 10*3/uL (ref 150–400)
RBC: 4.68 MIL/uL (ref 3.87–5.11)
RDW: 13.6 % (ref 11.5–15.5)
WBC: 6 10*3/uL (ref 4.0–10.5)

## 2016-05-20 LAB — LIPASE, BLOOD: Lipase: 44 U/L (ref 11–51)

## 2016-05-20 LAB — I-STAT TROPONIN, ED: Troponin i, poc: 0 ng/mL (ref 0.00–0.08)

## 2016-05-20 MED ORDER — HYDROXYZINE HCL 25 MG PO TABS: 25.0000 mg | ORAL_TABLET | Freq: Four times a day (QID) | ORAL | 0 refills | Status: DC | PRN

## 2016-05-20 MED ORDER — HYDROXYZINE HCL 25 MG PO TABS
25.0000 mg | ORAL_TABLET | Freq: Once | ORAL | Status: AC
  Administered 2016-05-20: 25 mg via ORAL
  Filled 2016-05-20: qty 1

## 2016-05-20 MED ORDER — POTASSIUM CHLORIDE CRYS ER 20 MEQ PO TBCR
20.0000 meq | EXTENDED_RELEASE_TABLET | Freq: Once | ORAL | Status: AC
  Administered 2016-05-20: 20 meq via ORAL
  Filled 2016-05-20: qty 1

## 2016-05-20 MED ORDER — POTASSIUM CHLORIDE CRYS ER 20 MEQ PO TBCR: 20.0000 meq | EXTENDED_RELEASE_TABLET | Freq: Every day | ORAL | 0 refills | Status: DC

## 2016-05-20 NOTE — ED Provider Notes (Signed)
[facility]-EMERGENCY DEPT Provider Note   CSN: [id number] Arrival date & time: 05/20/16  [id number]  By signing my name below, I, [name], attest that this documentation has been prepared under the direction and in the presence of Earley FavorGail Priest Lockridge, NP Electronically Signed: [name], ED Scribe. 05/20/16. 9:00 PM.   History   Chief Complaint Chief Complaint  Patient presents with  . Anxiety    HPI Katrina Nelson is a 50 y.o. female with a PMHx of anxiety, who presents to the Emergency Department brought in by EMS complaining of anxiety onset tonight PTA. Pt notes that she was in the grocery store tonight when she began to experience anxiety-like symptoms. Pt reports that when she began to have these feelings, she bit a piece of her prescribed anxiety medication, klonopin. Pt states that she only bit a piece of her klonopin due to when she takes a full dose, she feels like her body shuts down. Pt notes that her PCP prescribes her klonopin and she wasn't informed to see a psych counselor when the medication was initially prescribed to her. Pt denies seeing a psych counselor for her symptoms. Pt notes that she stopped by the fire department to have her vitals checked and was informed that her blood pressure was elevated to 260/140 she called 911 and brought into the ED for evaluation. Pt states that this is the third anxiety attack that she has had in the past couple months. Pt voices that she has a lot of life stressors, including not being where she wants to be career-wise. Pt reports that she hasn't been to her PCP in awhile due to being afraid of what he may tell her in regards to her symptoms. Pt is having associated symptoms of abdominal pain, heart palpitations, rhinorrhea, and nasal congestion. Pt reports that she has had cold-like symptoms prior to the onset of her current symptoms. She notes that she has tried a portion of her klonopin, VIcks, nyquil, for the relief of her symptoms. She denies fever,  chills, and any other symptoms.   The history is provided by the patient. No language interpreter was used.    Past Medical History:  Diagnosis Date  . Anxiety     There are no active problems to display for this patient.   Past Surgical History:  Procedure Laterality Date  . APPENDECTOMY    . CHOLECYSTECTOMY    . OVARY SURGERY      OB History    No data available       Home Medications    Prior to Admission medications   Medication Sig Start Date End Date Taking? Authorizing Provider  Ascorbic Acid (VITAMIN C) 100 MG tablet Take 100 mg by mouth daily.   Yes Historical Provider, MD  clonazePAM (KLONOPIN) 1 MG tablet Take 1 mg by mouth 2 (two) times daily as needed for anxiety.    Yes Historical Provider, MD  DEXILANT 60 MG capsule Take 60 mg by mouth daily.  05/19/16  Yes Historical Provider, MD  fish oil-omega-3 fatty acids 1000 MG capsule Take 2 g by mouth daily.     Yes Historical Provider, MD  meloxicam (MOBIC) 15 MG tablet Take 15 mg by mouth daily as needed for pain.  04/13/16  Yes Historical Provider, MD  cholecalciferol (VITAMIN D) 1000 UNITS tablet Take 1,000 Units by mouth daily.    Historical Provider, MD  HYDROcodone-acetaminophen (NORCO/VICODIN) 5-325 MG per tablet Take 1-2 tablets by mouth every 4 (four) hours as needed  for moderate pain or severe pain. Patient not taking: Reported on 05/20/2016 05/27/14   Harle BattiestElizabeth Tysinger, NP  hydrOXYzine (ATARAX/VISTARIL) 25 MG tablet Take 1 tablet (25 mg total) by mouth every 6 (six) hours as needed for anxiety. 05/20/16   Earley FavorGail Abbigael Detlefsen, NP  naproxen (NAPROSYN) 500 MG tablet Take 1 tablet (500 mg total) by mouth 2 (two) times daily with a meal. Patient not taking: Reported on 05/20/2016 05/27/14   Harle BattiestElizabeth Tysinger, NP  NON FORMULARY Calm magnesium    Historical Provider, MD  omeprazole (PRILOSEC) 20 MG capsule Take 1 capsule (20 mg total) by mouth daily. Patient not taking: Reported on 05/20/2016 05/21/13   April Palumbo,  MD  polyethylene glycol powder (MIRALAX) powder Take 255 g (1 Container total) by mouth once. Patient not taking: Reported on 05/20/2016 05/21/13   April Palumbo, MD  potassium chloride SA (K-DUR,KLOR-CON) 20 MEQ tablet Take 1 tablet (20 mEq total) by mouth daily. 05/20/16   Earley FavorGail Keya Wynes, NP  sertraline (ZOLOFT) 100 MG tablet Take 100 mg by mouth.  04/13/16   Historical Provider, MD  thyroid (ARMOUR) 60 MG tablet Take 60 mg by mouth daily.    Historical Provider, MD    Family History No family history on file.  Social History Social History  Substance Use Topics  . Smoking status: Never Smoker  . Smokeless tobacco: Never Used  . Alcohol use No     Allergies   Demerol; Ketorolac tromethamine; and Penicillins   Review of Systems Review of Systems  Constitutional: Negative for chills and fever.  HENT: Positive for congestion and rhinorrhea.   Cardiovascular: Positive for palpitations.  Gastrointestinal: Positive for abdominal pain.  Psychiatric/Behavioral: The patient is nervous/anxious.   All other systems reviewed and are negative.    Physical Exam Updated Vital Signs BP 144/75 (BP Location: Left Arm)   Pulse 95   Temp 98.1 F (36.7 C) (Oral)   Resp 18   Ht 5\' 9"  (1.753 m)   Wt 300 lb (136.1 kg)   LMP 08/29/2011   SpO2 99%   BMI 44.30 kg/m   Physical Exam  Constitutional: She is oriented to person, place, and time. She appears well-developed and well-nourished. No distress.  HENT:  Head: Normocephalic and atraumatic.  Eyes: EOM are normal.  Neck: Neck supple.  Cardiovascular: Normal rate, regular rhythm and normal heart sounds.  Exam reveals no gallop and no friction rub.   No murmur heard. Pulmonary/Chest: Effort normal and breath sounds normal. No respiratory distress. She has no wheezes. She has no rales.  Abdominal: Soft. She exhibits no distension. There is no tenderness.  Musculoskeletal: Normal range of motion.  Neurological: She is alert and oriented  to person, place, and time.  Skin: Skin is warm and dry.  Psychiatric: Her behavior is normal. Her mood appears anxious.  Nursing note and vitals reviewed.    ED Treatments / Results  DIAGNOSTIC STUDIES: Oxygen Saturation is 99% on RA, nl by my interpretation.    COORDINATION OF CARE: 8:42 PM Discussed treatment plan with pt at bedside which includes labs, UA, atarax, and pt agreed to plan.   Labs (all labs ordered are listed, but only abnormal results are displayed) Labs Reviewed  COMPREHENSIVE METABOLIC PANEL - Abnormal; Notable for the following:       Result Value   Potassium 3.3 (*)    Glucose, Bld 111 (*)    Creatinine, Ser 1.04 (*)    Total Bilirubin 1.3 (*)    All other  components within normal limits  CBC WITH DIFFERENTIAL/PLATELET  LIPASE, BLOOD  I-STAT TROPOININ, ED    Procedures Procedures (including critical care time)  Medications Ordered in ED Medications  hydrOXYzine (ATARAX/VISTARIL) tablet 25 mg (25 mg Oral Given 05/20/16 2132)  potassium chloride SA (K-DUR,KLOR-CON) CR tablet 20 mEq (20 mEq Oral Given 05/20/16 2259)     Initial Impression / Assessment and Plan / ED Course  I have reviewed the triage vital signs and the nursing notes.  Pertinent labs that were available during my care of the patient were reviewed by me and considered in my medical decision making (see chart for details).  Clinical Course      She still appears extremely anxious.  She'll be given 25 mg of Atarax for patient's reassurance.  I will obtain CBC CMet lipase, troponin and EKG I doubt that these will be contributory to patient's current anxiety state Patient exhibit Atarax and is feeling much better.  Have discussed her options.  She was also found to be slightly hypokalemic is been supplemented with 20 mEq of potassium.  She was given 5 days.  Prescription for same.  She does be given a prescription for Atarax to use every 6 hours as needed for her anxiety attacks.   Follow-up with her physician next week for a redraw of her potassium level as well as discuss counseling  Final Clinical Impressions(s) / ED Diagnoses   Final diagnoses:  Anxiety attack  Palpitations  Hypokalemia    New Prescriptions New Prescriptions   HYDROXYZINE (ATARAX/VISTARIL) 25 MG TABLET    Take 1 tablet (25 mg total) by mouth every 6 (six) hours as needed for anxiety.   POTASSIUM CHLORIDE SA (K-DUR,KLOR-CON) 20 MEQ TABLET    Take 1 tablet (20 mEq total) by mouth daily.   I personally performed the services described in this documentation, which was scribed in my presence. The recorded information has been reviewed and is accurate.    Earley Favor, NP 05/20/16 2310    Earley Favor, NP 05/20/16 1610    Doug Sou, MD 05/21/16 9604

## 2016-05-20 NOTE — ED Triage Notes (Signed)
Patient via [facility] c/o anxiety. Patient states she was in the grocery store when she began to feel "butterflies" in her stomach and feeling like she "couldnt breath" patient states when she began to feel like that she went to her car and took her prescribed anxiety medication. Patient states she feels more calm now, but c/o pain when "pressing" into upper mid abdomin. Patient voices a lot of concerns about life stressors and career goals that have not been accomplished.

## 2016-05-20 NOTE — Discharge Instructions (Signed)
Take the potassium as directed for the next 5 days Try to eat a diet containing potassium  Make an appointment with your PCP to have the Potassium level rechecked in 1 week Ask for a referral to a counselor to discuss you anxiety You have been given a prescription for Atarax to use as needed for anxiety attacks

## 2016-05-20 NOTE — ED Notes (Signed)
Patient c/o mid chest pain that has increased since arriving to ED

## 2016-05-20 NOTE — ED Notes (Signed)
Bed: [id number] Expected date:  Expected time:  Means of arrival:  Comments: 50 yo anxiety

## 2016-05-20 NOTE — ED Notes (Signed)
Pt states that she had an episode of anxiety today, Pt reports that her heart was racing and felt as though she was going to pass out. Pt reports that she has had these episodes before and ususally takes anti anxiety medication for management. Pt has no SI/HI .

## 2016-05-20 NOTE — ED Notes (Signed)
Bed: [id number] Expected date:  Expected time:  Means of arrival:  Comments: Hold for rm 33

## 2016-07-03 ENCOUNTER — Ambulatory Visit (HOSPITAL_COMMUNITY): Payer: Managed Care, Other (non HMO) | Admitting: Licensed Clinical Social Worker

## 2016-09-10 ENCOUNTER — Encounter: Payer: Self-pay | Admitting: *Deleted

## 2016-09-10 ENCOUNTER — Emergency Department (INDEPENDENT_AMBULATORY_CARE_PROVIDER_SITE_OTHER)
Admission: EM | Admit: 2016-09-10 | Discharge: 2016-09-10 | Disposition: A | Discharge status: Home / Self Care | Payer: 59 | Source: Home / Self Care | Attending: Family Medicine | Admitting: Family Medicine

## 2016-09-10 DIAGNOSIS — K219 Gastro-esophageal reflux disease without esophagitis: Secondary | ICD-10-CM | POA: Diagnosis not present

## 2016-09-10 DIAGNOSIS — R030 Elevated blood-pressure reading, without diagnosis of hypertension: Secondary | ICD-10-CM

## 2016-09-10 DIAGNOSIS — J302 Other seasonal allergic rhinitis: Secondary | ICD-10-CM

## 2016-09-10 HISTORY — DX: Gastro-esophageal reflux disease without esophagitis: K21.9

## 2016-09-10 MED ORDER — FLUNISOLIDE 25 MCG/ACT (0.025%) NA SOLN: 2.0000 | Freq: Two times a day (BID) | NASAL | 1 refills | Status: DC

## 2016-09-10 MED ORDER — RANITIDINE HCL 150 MG PO TABS: 150.0000 mg | ORAL_TABLET | Freq: Two times a day (BID) | ORAL | 0 refills | Status: DC

## 2016-09-10 NOTE — ED Triage Notes (Signed)
Pt c/o elevated BP with a reading of 150/90 last night and  epigastric pain x 1 day. Denies nausea and vomiting. Normal BM yesterday.

## 2016-09-10 NOTE — ED Provider Notes (Signed)
[facility]    CSN: [id number] Arrival date & time: 09/10/16  6578     History   Chief Complaint Chief Complaint  Patient presents with  . Abdominal Pain    HPI Katrina Nelson is a 51 y.o. female.   Patient presents with several complaints: 1)  She reports that she has been under increased stress for several months, which was exacerbated when she had an argument with her pastor three days ago.  Since then she has had intermittent lower abdominal discomfort radiating to her upper abdomen.  No nausea/vomiting, and her bowel movements have been normal.  She reports that she has a history of GERD which had been controlled with Dexilant, but she discontinued when she read about some possible adverse effects of Dexilant.  She has tried to control her reflux symptoms with diet. 2)  She reports that her blood pressure has been elevated during the past two days.  She measured 150/90 last night.  Her BP is normally about 117/70.  She denies chest pain or shortness of breath.  She states that her symptoms improve when she takes clonazepam on a PRN basis.  She had been prescribed a daily medication to control anxiety but decided not to take it.  She does not feel down or depressed. 3)  She has a history of seasonal rhinitis, and recently has had increased sinus congestion without sore throat or cough.  She had had a good response in the past to Qnasl, but her insurance would not cover.  Flonase does not work very well for her.   The history is provided by the patient.  Abdominal Pain  Pain location:  Generalized Pain quality: cramping   Pain radiates to:  Does not radiate Pain severity:  Mild Onset quality:  Gradual Duration:  2 days Timing:  Intermittent Progression:  Waxing and waning Chronicity:  Recurrent Context comment:  Stress Relieved by:  None tried Exacerbated by: stress. Ineffective treatments:  None tried Associated symptoms: no anorexia, no belching, no chest  pain, no chills, no constipation, no cough, no diarrhea, no dysuria, no fatigue, no fever, no flatus, no hematemesis, no hematochezia, no hematuria, no melena, no nausea, no shortness of breath, no sore throat, no vaginal bleeding, no vaginal discharge and no vomiting     Past Medical History:  Diagnosis Date  . Anxiety   . GERD (gastroesophageal reflux disease)     There are no active problems to display for this patient.   Past Surgical History:  Procedure Laterality Date  . APPENDECTOMY    . CHOLECYSTECTOMY    . OVARY SURGERY      OB History    No data available       Home Medications    Prior to Admission medications   Medication Sig Start Date End Date Taking? Authorizing Provider  BLACK CURRANT SEED OIL PO Take by mouth.   Yes Historical Provider, MD  clonazePAM (KLONOPIN) 1 MG tablet Take 1 mg by mouth 2 (two) times daily as needed for anxiety.    Yes Historical Provider, MD  Ascorbic Acid (VITAMIN C) 100 MG tablet Take 100 mg by mouth daily.    Historical Provider, MD  cholecalciferol (VITAMIN D) 1000 UNITS tablet Take 1,000 Units by mouth daily.    Historical Provider, MD  fish oil-omega-3 fatty acids 1000 MG capsule Take 2 g by mouth daily.      Historical Provider, MD  flunisolide (NASALIDE) 25 MCG/ACT (0.025%) SOLN Place 2 sprays  into the nose 2 (two) times daily. 09/10/16   Lattie HawStephen A Neera Teng, MD  hydrOXYzine (ATARAX/VISTARIL) 25 MG tablet Take 1 tablet (25 mg total) by mouth every 6 (six) hours as needed for anxiety. 05/20/16   Earley FavorGail Schulz, NP  meloxicam (MOBIC) 15 MG tablet Take 15 mg by mouth daily as needed for pain.  04/13/16   Historical Provider, MD  NON FORMULARY Calm magnesium    Historical Provider, MD  potassium chloride SA (K-DUR,KLOR-CON) 20 MEQ tablet Take 1 tablet (20 mEq total) by mouth daily. 05/20/16   Earley FavorGail Schulz, NP  ranitidine (ZANTAC) 150 MG tablet Take 1 tablet (150 mg total) by mouth 2 (two) times daily. 09/10/16   Lattie HawStephen A Richa Shor, MD    sertraline (ZOLOFT) 100 MG tablet Take 100 mg by mouth.  04/13/16   Historical Provider, MD  thyroid (ARMOUR) 60 MG tablet Take 60 mg by mouth daily.    Historical Provider, MD    Family History Family History  Problem Relation Age of Onset  . Alcoholism Mother     Social History Social History  Substance Use Topics  . Smoking status: Never Smoker  . Smokeless tobacco: Never Used  . Alcohol use No     Allergies   Demerol; Ketorolac tromethamine; and Penicillins   Review of Systems Review of Systems  Constitutional: Negative for appetite change, chills, diaphoresis, fatigue and fever.  HENT: Negative for sore throat.   Eyes: Negative.   Respiratory: Negative for cough and shortness of breath.   Cardiovascular: Negative for chest pain, palpitations and leg swelling.  Gastrointestinal: Positive for abdominal pain. Negative for anorexia, constipation, diarrhea, flatus, hematemesis, hematochezia, melena, nausea and vomiting.  Genitourinary: Negative for dysuria, flank pain, frequency, hematuria, pelvic pain, urgency, vaginal bleeding and vaginal discharge.  Musculoskeletal: Negative.   Skin: Negative.   Psychiatric/Behavioral: Negative for dysphoric mood. The patient is nervous/anxious.      Physical Exam Triage Vital Signs ED Triage Vitals  Enc Vitals Group     BP 09/10/16 0951 136/83     Pulse Rate 09/10/16 0951 68     Resp 09/10/16 0951 18     Temp 09/10/16 0951 98 F (36.7 C)     Temp Source 09/10/16 0951 Oral     SpO2 09/10/16 0951 99 %     Weight 09/10/16 0952 292 lb (132.5 kg)     Height 09/10/16 0952 5\' 9"  (1.753 m)     Head Circumference --      Peak Flow --      Pain Score 09/10/16 1000 0     Pain Loc --      Pain Edu? --      Excl. in GC? --    No data found.   Updated Vital Signs BP 136/83 (BP Location: Left Arm)   Pulse 68   Temp 98 F (36.7 C) (Oral)   Resp 18   Ht 5\' 9"  (1.753 m)   Wt 292 lb (132.5 kg)   LMP 08/29/2011   SpO2 99%    BMI 43.12 kg/m   Visual Acuity Right Eye Distance:   Left Eye Distance:   Bilateral Distance:    Right Eye Near:   Left Eye Near:    Bilateral Near:     Physical Exam Nursing notes and Vital Signs reviewed. Appearance:  Patient appears stated age, and in no acute distress. Psychiatric:  Patient is alert and oriented with good eye contact.  Thoughts are organized.  No psychomotor retardation.  Memory intact.  Not suicidal.  Euthymic mood and affect. Eyes:  Pupils are equal, round, and reactive to light and accomodation.  Extraocular movement is intact.  Conjunctivae are not inflamed  Ears:  Canals normal.  Tympanic membranes normal.  Nose:  Congested turbinates.  No sinus tenderness.    Pharynx:  Normal Neck:  Supple.  No adenopathy Lungs:  Clear to auscultation.  Breath sounds are equal.  Moving air well. Heart:  Regular rate and rhythm without murmurs, rubs, or gallops.  Abdomen:  Nontender without masses or hepatosplenomegaly.  Bowel sounds are present.  No CVA or flank tenderness.  Extremities:  No edema.  Skin:  No rash present.    UC Treatments / Results  Labs (all labs ordered are listed, but only abnormal results are displayed) Labs Reviewed - No data to display  EKG  EKG Interpretation None       Radiology No results found.  Procedures Procedures (including critical care time)  Medications Ordered in UC Medications - No data to display   Initial Impression / Assessment and Plan / UC Course  I have reviewed the triage vital signs and the nursing notes.  Pertinent labs & imaging results that were available during my care of the patient were reviewed by me and considered in my medical decision making (see chart for details).    Suspect underlying anxiety, resulting in increased GERD symptoms and elevated BP Trial of Zantac (patient reluctant to resume Dexilant) Trial of flunisolide nasal spray (reasonably priced). Continue Clonazepam as needed (would  probably benefit from agent such as Zoloft). Followup with Family Doctor.     Final Clinical Impressions(s) / UC Diagnoses   Final diagnoses:  Gastroesophageal reflux disease, esophagitis presence not specified  Chronic seasonal allergic rhinitis, unspecified trigger  Blood pressure elevated without history of HTN    New Prescriptions New Prescriptions   FLUNISOLIDE (NASALIDE) 25 MCG/ACT (0.025%) SOLN    Place 2 sprays into the nose 2 (two) times daily.   RANITIDINE (ZANTAC) 150 MG TABLET    Take 1 tablet (150 mg total) by mouth 2 (two) times daily.     Lattie Haw, MD 09/10/16 [phone]

## 2016-09-10 NOTE — Discharge Instructions (Signed)
Monitor blood pressure more frequently at different times of day and record on a calendar.  Followup with family doctor if BP remains elevated. ° °

## 2016-12-03 ENCOUNTER — Other Ambulatory Visit: Payer: Self-pay | Admitting: Radiology

## 2017-05-13 LAB — HM COLONOSCOPY

## 2019-09-12 ENCOUNTER — Other Ambulatory Visit: Payer: Self-pay | Admitting: Internal Medicine

## 2019-09-12 DIAGNOSIS — E063 Autoimmune thyroiditis: Secondary | ICD-10-CM

## 2019-09-12 DIAGNOSIS — E042 Nontoxic multinodular goiter: Secondary | ICD-10-CM

## 2019-09-19 ENCOUNTER — Ambulatory Visit
Admission: RE | Admit: 2019-09-19 | Discharge: 2019-09-19 | Disposition: A | Discharge status: Home / Self Care | Payer: 59 | Source: Ambulatory Visit | Attending: Internal Medicine | Admitting: Internal Medicine

## 2019-09-19 DIAGNOSIS — E063 Autoimmune thyroiditis: Secondary | ICD-10-CM

## 2019-09-19 DIAGNOSIS — E042 Nontoxic multinodular goiter: Secondary | ICD-10-CM

## 2019-11-05 ENCOUNTER — Emergency Department (HOSPITAL_BASED_OUTPATIENT_CLINIC_OR_DEPARTMENT_OTHER)
Admission: EM | Admit: 2019-11-05 | Discharge: 2019-11-05 | Disposition: A | Discharge status: Home / Self Care | Payer: 59 | Attending: Emergency Medicine | Admitting: Emergency Medicine

## 2019-11-05 ENCOUNTER — Other Ambulatory Visit: Payer: Self-pay

## 2019-11-05 ENCOUNTER — Encounter (HOSPITAL_BASED_OUTPATIENT_CLINIC_OR_DEPARTMENT_OTHER): Payer: Self-pay | Admitting: Emergency Medicine

## 2019-11-05 DIAGNOSIS — F419 Anxiety disorder, unspecified: Secondary | ICD-10-CM | POA: Insufficient documentation

## 2019-11-05 DIAGNOSIS — Z79899 Other long term (current) drug therapy: Secondary | ICD-10-CM | POA: Diagnosis not present

## 2019-11-05 HISTORY — DX: Obesity, unspecified: E66.9

## 2019-11-05 MED ORDER — SUCRALFATE 1 GM/10ML PO SUSP
1.0000 g | Freq: Once | ORAL | Status: AC
  Administered 2019-11-05: 04:00:00 1 g via ORAL
  Filled 2019-11-05: qty 10

## 2019-11-05 MED ORDER — CLONAZEPAM 1 MG PO TABS: 1.0000 mg | ORAL_TABLET | Freq: Two times a day (BID) | ORAL | 0 refills | Status: AC | PRN

## 2019-11-05 NOTE — ED Triage Notes (Signed)
Pt reports feeling anxious tonight and felt like she was having a panic attack. Pt reports hx of same and symptoms consistent with previous panic attacks. Pt states she did take clonazepam about 1 hour PTA.

## 2019-11-05 NOTE — ED Provider Notes (Signed)
[facility] Provider Note: Lowella Dell, MD, FACEP  CSN: [id number] MRN: [id number] ARRIVAL: 11/05/19 at 0327 ROOM: [facility]06/[facility]06   CHIEF COMPLAINT  Anxiety   HISTORY OF PRESENT ILLNESS  11/05/19 4:03 AM Katrina Nelson is a 54 y.o. female with a history of anxiety who presents with complaints of an exacerbation of her anxiety.  Specifically she awakened about 2 AM with a sensation of epigastric discomfort which she describes as her stomach feeling "nervous".  She had no associated chest pain or nausea.  She did have some mild shortness of breath.  She took a dose of clonazepam about an hour prior to arrival with improvement in her symptoms but they have not completely abated.  Her symptoms are moderate.   Past Medical History:  Diagnosis Date  . Anxiety   . GERD (gastroesophageal reflux disease)   . Obesity     Past Surgical History:  Procedure Laterality Date  . APPENDECTOMY    . CHOLECYSTECTOMY    . OVARY SURGERY      Family History  Problem Relation Age of Onset  . Alcoholism Mother     Social History   Tobacco Use  . Smoking status: Never Smoker  . Smokeless tobacco: Never Used  Substance Use Topics  . Alcohol use: No  . Drug use: No    Prior to Admission medications   Medication Sig Start Date End Date Taking? Authorizing Provider  Ascorbic Acid (VITAMIN C) 100 MG tablet Take 100 mg by mouth daily.    [provider]  BLACK CURRANT SEED OIL PO Take by mouth.    [provider]  cholecalciferol (VITAMIN D) 1000 UNITS tablet Take 1,000 Units by mouth daily.    [provider]  clonazePAM (KLONOPIN) 1 MG tablet Take 1 tablet (1 mg total) by mouth 2 (two) times daily as needed for anxiety. 11/05/19   Tijana Walder, MD  fish oil-omega-3 fatty acids 1000 MG capsule Take 2 g by mouth daily.      [provider]  flunisolide (NASALIDE) 25 MCG/ACT (0.025%) SOLN Place 2 sprays into the nose 2 (two) times daily. 09/10/16    Lattie Haw, MD  sertraline (ZOLOFT) 100 MG tablet Take 100 mg by mouth.  04/13/16   [provider]  thyroid (ARMOUR) 60 MG tablet Take 60 mg by mouth daily.    [provider]  potassium chloride SA (K-DUR,KLOR-CON) 20 MEQ tablet Take 1 tablet (20 mEq total) by mouth daily. 05/20/16 11/05/19  Earley Favor, NP  ranitidine (ZANTAC) 150 MG tablet Take 1 tablet (150 mg total) by mouth 2 (two) times daily. 09/10/16 11/05/19  Lattie Haw, MD    Allergies Demerol, Ketorolac tromethamine, and Penicillins   REVIEW OF SYSTEMS  Negative except as noted here or in the History of Present Illness.   PHYSICAL EXAMINATION  Initial Vital Signs Blood pressure 129/80, pulse 72, temperature (!) 97.5 F (36.4 C), temperature source Oral, resp. rate 16, height 5\' 9"  (1.753 m), weight 132.5 kg, last menstrual period 08/29/2011, SpO2 100 %.  Examination General: Well-developed, well-nourished female in no acute distress; appearance consistent with age of record HENT: normocephalic; atraumatic Eyes: pupils equal, round and reactive to light; extraocular muscles intact Neck: supple Heart: regular rate and rhythm Lungs: clear to auscultation bilaterally Abdomen: soft; nondistended; mild subxiphoid tenderness; bowel sounds present Extremities: No deformity; full range of motion; pulses normal Neurologic: Awake, alert and oriented; motor function intact in all extremities and symmetric; no  facial droop Skin: Warm and dry Psychiatric: Anxious   RESULTS  Summary of this visit's results, reviewed and interpreted by myself:   EKG Interpretation  Date/Time:  Saturday Nov 05 2019 04:27:38 EDT Ventricular Rate:  69 PR Interval:    QRS Duration: 96 QT Interval:  382 QTC Calculation: 410 R Axis:   46 Text Interpretation: Sinus rhythm Artifact No significant change was found Confirmed by Jaziya Obarr [id number]) on 11/05/2019 4:32:42 AM      Laboratory Studies: No results found for  this or any previous visit (from the past 24 hour(s)). Imaging Studies: No results found.  ED COURSE and MDM  Nursing notes, initial and subsequent vitals signs, including pulse oximetry, reviewed and interpreted by myself.  Vitals:   11/05/19 0342 11/05/19 0344 11/05/19 0415  BP:  129/80 118/62  Pulse:  72 68  Resp:  16   Temp:  (!) 97.5 F (36.4 C)   TempSrc:  Oral   SpO2:  100% 99%  Weight: 132.5 kg    Height: 5\' 9"  (1.753 m)     Medications  sucralfate (CARAFATE) 1 GM/10ML suspension 1 g (1 g Oral Given 11/05/19 0413)    4:59 AM Patient's abdominal discomfort improved after oral Carafate.  She is requesting a refill of her clonazepam as her current prescription is out of date.  PROCEDURES  Procedures   ED DIAGNOSES     ICD-10-CM   1. Anxiety  F41.9        Jeriko Kowalke, Jenny Reichmann, MD 11/05/19 5681

## 2019-12-27 ENCOUNTER — Other Ambulatory Visit: Payer: Self-pay

## 2019-12-27 ENCOUNTER — Emergency Department (HOSPITAL_BASED_OUTPATIENT_CLINIC_OR_DEPARTMENT_OTHER)
Admission: EM | Admit: 2019-12-27 | Discharge: 2019-12-27 | Disposition: A | Discharge status: Home / Self Care | Payer: 59 | Attending: Emergency Medicine | Admitting: Emergency Medicine

## 2019-12-27 ENCOUNTER — Encounter (HOSPITAL_BASED_OUTPATIENT_CLINIC_OR_DEPARTMENT_OTHER): Payer: Self-pay | Admitting: *Deleted

## 2019-12-27 DIAGNOSIS — Y9389 Activity, other specified: Secondary | ICD-10-CM | POA: Insufficient documentation

## 2019-12-27 DIAGNOSIS — Z23 Encounter for immunization: Secondary | ICD-10-CM | POA: Insufficient documentation

## 2019-12-27 DIAGNOSIS — S61212A Laceration without foreign body of right middle finger without damage to nail, initial encounter: Secondary | ICD-10-CM | POA: Insufficient documentation

## 2019-12-27 DIAGNOSIS — Y929 Unspecified place or not applicable: Secondary | ICD-10-CM | POA: Insufficient documentation

## 2019-12-27 DIAGNOSIS — W268XXA Contact with other sharp object(s), not elsewhere classified, initial encounter: Secondary | ICD-10-CM | POA: Insufficient documentation

## 2019-12-27 DIAGNOSIS — Y999 Unspecified external cause status: Secondary | ICD-10-CM | POA: Insufficient documentation

## 2019-12-27 MED ORDER — TETANUS-DIPHTH-ACELL PERTUSSIS 5-2.5-18.5 LF-MCG/0.5 IM SUSP
0.5000 mL | Freq: Once | INTRAMUSCULAR | Status: AC
  Administered 2019-12-27: 0.5 mL via INTRAMUSCULAR
  Filled 2019-12-27: qty 0.5

## 2019-12-27 NOTE — ED Notes (Signed)
ED Provider at bedside. 

## 2019-12-27 NOTE — Discharge Instructions (Addendum)
I have placed Dermabond to your injury, please keep this in place until it falls off.  You finger was also placed on a splint.  You may discontinue splint after 2 days.  There experience any redness, worsening pain or symptoms please return to the emergency room.

## 2019-12-27 NOTE — ED Triage Notes (Signed)
Laceration to the back of her right middle finger on a sharp piece of metal. Bleeding controlled.

## 2019-12-27 NOTE — ED Provider Notes (Signed)
[facility] EMERGENCY DEPARTMENT Provider Note   CSN: [id number] Arrival date & time: 12/27/19  2045     History Chief Complaint  Patient presents with  . Laceration    Katrina Nelson is a 54 y.o. female.  54 y.o female with a PMH of Anxiety, GERD resents to the ED with a chief complaint of right hand laceration after cleaning pots.  She reports she cut the right middle digit with a sharp edge.  Bleeding was controlled at the time.  She does have pain with flexion however sensation is intact.  Bleeding was controlled with pressure.  Last tetanus vaccine is unknown.  No other injuries or complaints.  No foreign bodies.  The history is provided by the patient.  Laceration Associated symptoms: no fever        Past Medical History:  Diagnosis Date  . Anxiety   . GERD (gastroesophageal reflux disease)   . Obesity     There are no problems to display for this patient.   Past Surgical History:  Procedure Laterality Date  . APPENDECTOMY    . CHOLECYSTECTOMY    . OVARY SURGERY       OB History   No obstetric history on file.     Family History  Problem Relation Age of Onset  . Alcoholism Mother     Social History   Tobacco Use  . Smoking status: Never Smoker  . Smokeless tobacco: Never Used  Substance Use Topics  . Alcohol use: No  . Drug use: No    Home Medications Prior to Admission medications   Medication Sig Start Date End Date Taking? Authorizing Provider  Ascorbic Acid (VITAMIN C) 100 MG tablet Take 100 mg by mouth daily.   Yes [provider]  BLACK CURRANT SEED OIL PO Take by mouth.   Yes [provider]  cholecalciferol (VITAMIN D) 1000 UNITS tablet Take 1,000 Units by mouth daily.   Yes [provider]  clonazePAM (KLONOPIN) 1 MG tablet Take 1 tablet (1 mg total) by mouth 2 (two) times daily as needed for anxiety. 11/05/19  Yes Molpus, John, MD  fish oil-omega-3 fatty acids 1000 MG capsule Take 2 g by mouth  daily.     Yes [provider]  flunisolide (NASALIDE) 25 MCG/ACT (0.025%) SOLN Place 2 sprays into the nose 2 (two) times daily. 09/10/16  Yes Lattie Haw, MD  sertraline (ZOLOFT) 100 MG tablet Take 100 mg by mouth.  04/13/16  Yes [provider]  thyroid (ARMOUR) 60 MG tablet Take 60 mg by mouth daily.   Yes [provider]  potassium chloride SA (K-DUR,KLOR-CON) 20 MEQ tablet Take 1 tablet (20 mEq total) by mouth daily. 05/20/16 11/05/19  Earley Favor, NP  ranitidine (ZANTAC) 150 MG tablet Take 1 tablet (150 mg total) by mouth 2 (two) times daily. 09/10/16 11/05/19  Lattie Haw, MD    Allergies    Demerol, Ketorolac tromethamine, and Penicillins  Review of Systems   Review of Systems  Constitutional: Negative for fever.  Skin: Positive for wound. Negative for pallor.    Physical Exam Updated Vital Signs BP 121/73   Pulse 75   Temp 97.9 F (36.6 C) (Oral)   Resp 16   Ht 5\' 9"  (1.753 m)   Wt 136.1 kg   LMP 08/29/2011   SpO2 99%   BMI 44.30 kg/m   Physical Exam Vitals and nursing note reviewed.  Constitutional:      Appearance: Normal  appearance.  HENT:     Head: Normocephalic and atraumatic.     Mouth/Throat:     Mouth: Mucous membranes are moist.  Cardiovascular:     Rate and Rhythm: Normal rate.  Pulmonary:     Effort: Pulmonary effort is normal.  Abdominal:     General: Abdomen is flat.  Musculoskeletal:     Right hand: Laceration and tenderness present. No bony tenderness. Normal range of motion. Normal strength. Normal sensation. There is no disruption of two-point discrimination. Normal capillary refill. Normal pulse.     Cervical back: Normal range of motion and neck supple.     Comments: 0.5 centimeter laceration to the middle PIP. Sensation is intact, capillary refill is intact.   Skin:    General: Skin is warm and dry.  Neurological:     Mental Status: She is alert and oriented to person, place, and time.     ED Results  / Procedures / Treatments   Labs (all labs ordered are listed, but only abnormal results are displayed) Labs Reviewed - No data to display  EKG None  Radiology No results found.  Procedures .Marland KitchenLaceration Repair  Date/Time: 12/27/2019 9:56 PM Performed by: Claude Manges, PA-C Authorized by: Claude Manges, PA-C   Consent:    Consent obtained:  Verbal   Consent given by:  Patient   Risks discussed:  Infection, pain and poor cosmetic result   Alternatives discussed:  No treatment Anesthesia (see MAR for exact dosages):    Anesthesia method:  None Laceration details:    Location:  Finger   Finger location:  R long finger   Length (cm):  0.5   Depth (mm):  0.3 Exploration:    Hemostasis achieved with:  Direct pressure Treatment:    Area cleansed with:  Soap and water   Amount of cleaning:  Extensive Skin repair:    Repair method:  Tissue adhesive Approximation:    Approximation:  Close Post-procedure details:    Dressing:  Splint for protection   Patient tolerance of procedure:  Tolerated well, no immediate complications   (including critical care time)  Medications Ordered in ED Medications  Tdap (BOOSTRIX) injection 0.5 mL (has no administration in time range)    ED Course  I have reviewed the triage vital signs and the nursing notes.  Pertinent labs & imaging results that were available during my care of the patient were reviewed by me and considered in my medical decision making (see chart for details).    MDM Rules/Calculators/A&P    Patient with no pertinent past medical history presents to the ED status post laceration to her right hand.  Patient was washing some pots when she suddenly cut herself in the middle right digit.  She reports pain along the area, bleeding was controlled.  Currently not on any blood thinners.  Last tetanus is unknown.  We discussed repair with stitches versus Dermabond, patient is opting for Dermabond at this time along with  splinting for healing.  Vitals are within normal limits.  We obtain tetanus status.   I have repaired patient's laceration with Dermabond along with placed in a splint as she voiced that not being able to tolerate stitches at this time.  We discussed treatment with splint for the next 2 days along with discontinue this and continue with normal range of motion.  Tissue adhesive will likely fall off in the next 7 days.  Patient understands and agrees to management, return precautions discussed at length.  Portions of this note were generated with Scientist, clinical (histocompatibility and immunogenetics). Dictation errors may occur despite best attempts at proofreading.  Final Clinical Impression(s) / ED Diagnoses Final diagnoses:  Laceration of right middle finger without foreign body without damage to nail, initial encounter    Rx / DC Orders ED Discharge Orders    None       Claude Manges, PA-C 01/09/20 0953    Melene Plan, DO 01/09/20 1059

## 2021-09-12 IMAGING — US US THYROID
1 series · 14 of 25 positions shown · non-contrast
Comparison: 12/20/2013

CLINICAL DATA: Hashimoto's thyroiditis

EXAM:
THYROID ULTRASOUND
TECHNIQUE: Ultrasound examination of the thyroid gland and adjacent soft
tissues was performed.

[Series 1: us thyroid · 0.06mm/px · 14 of 51 slices shown]
[im 1/51]
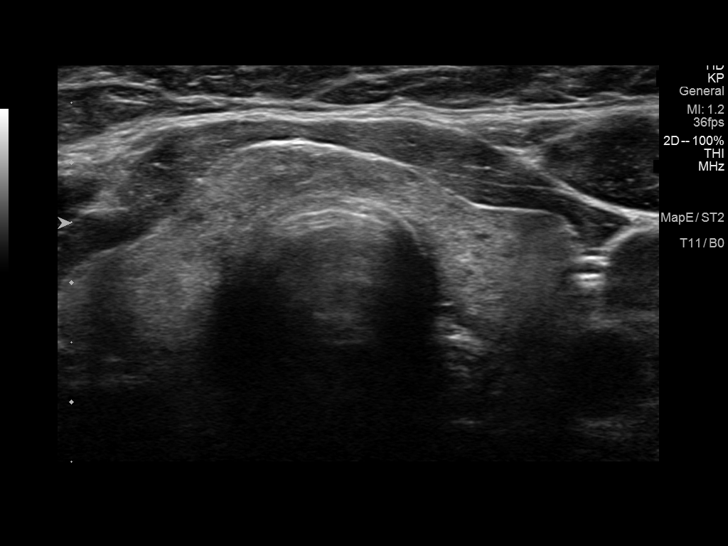
[im 5/51]
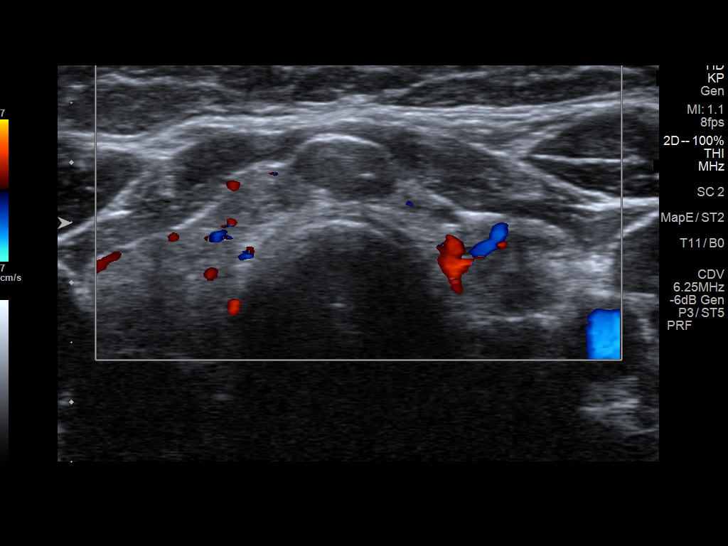
[im 9/51]
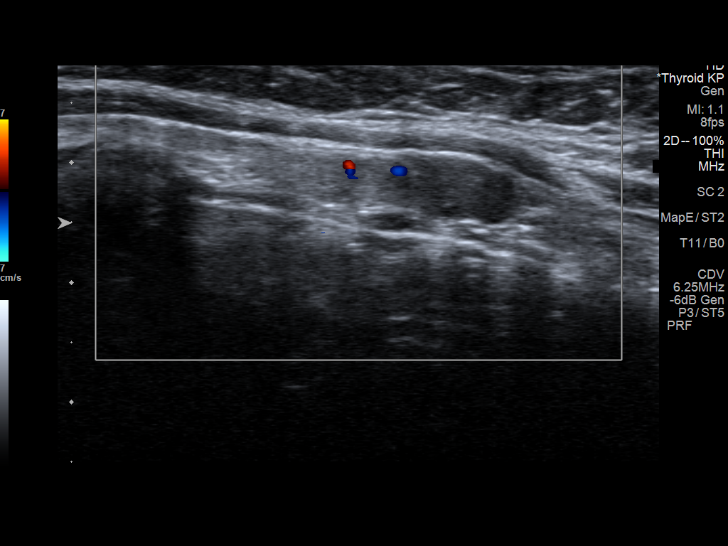
[im 13/51]
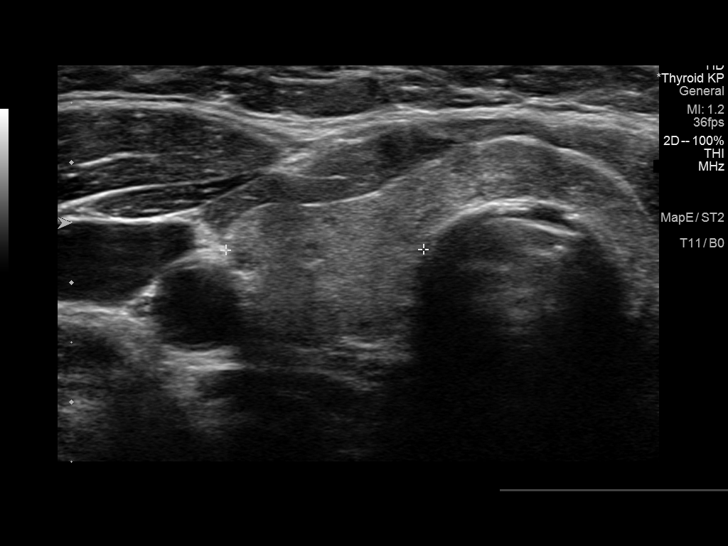
[im 17/51]
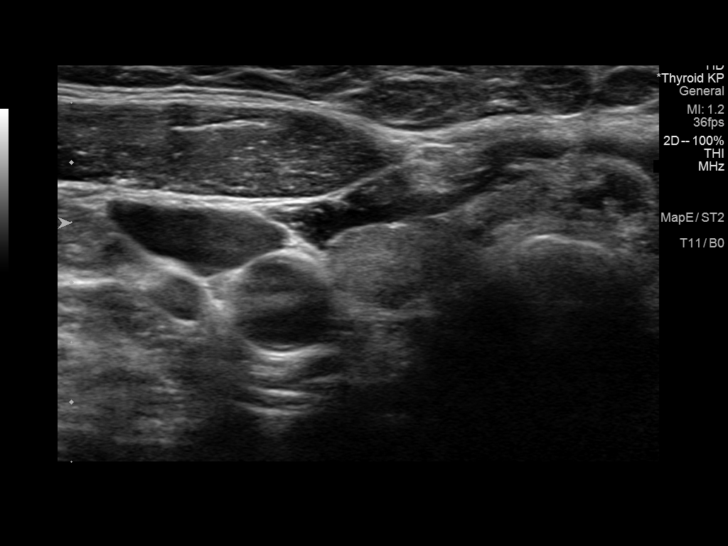
[im 19/51]
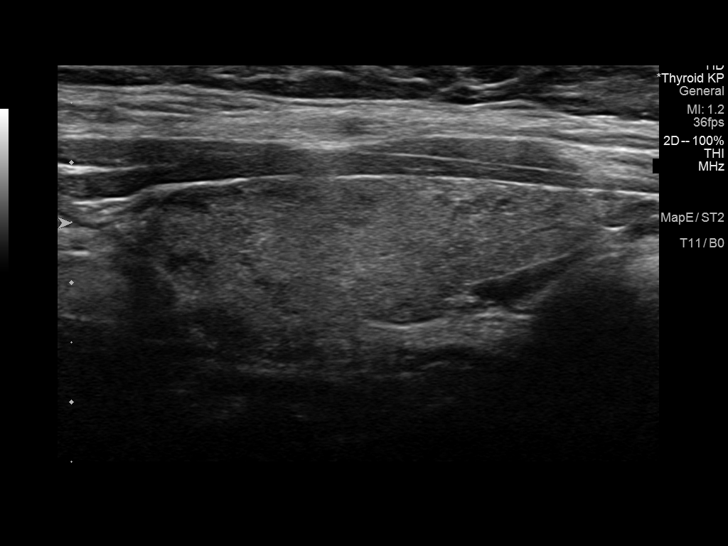
[im 23/51]
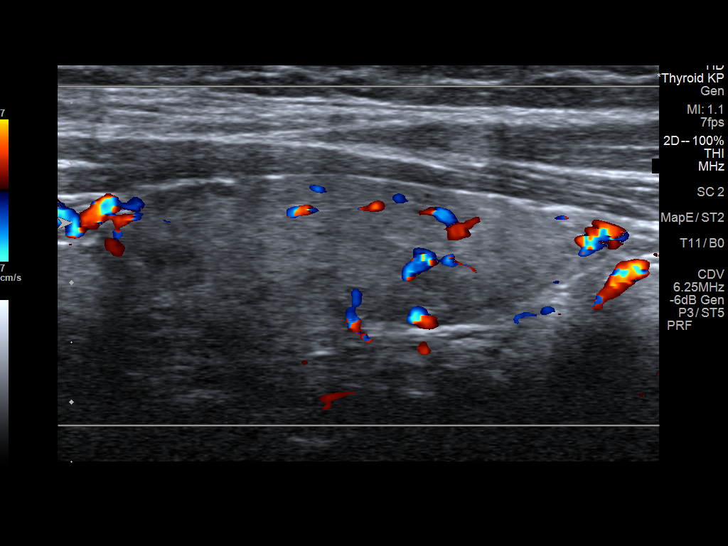
[im 28/51]
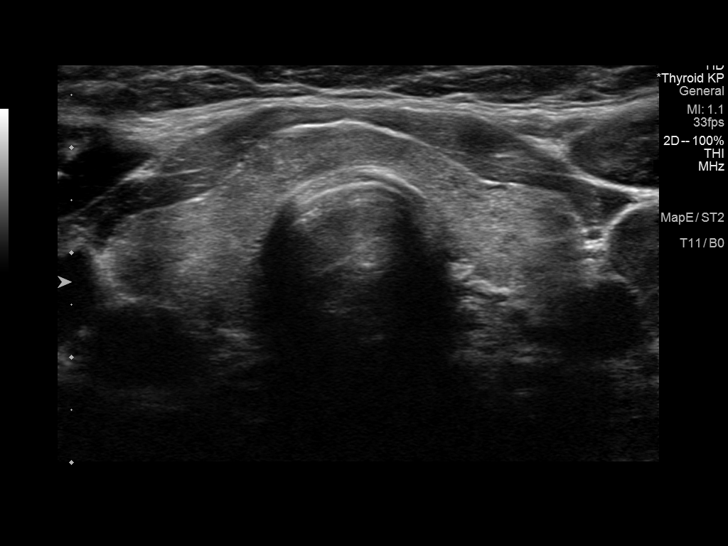
[im 32/51]
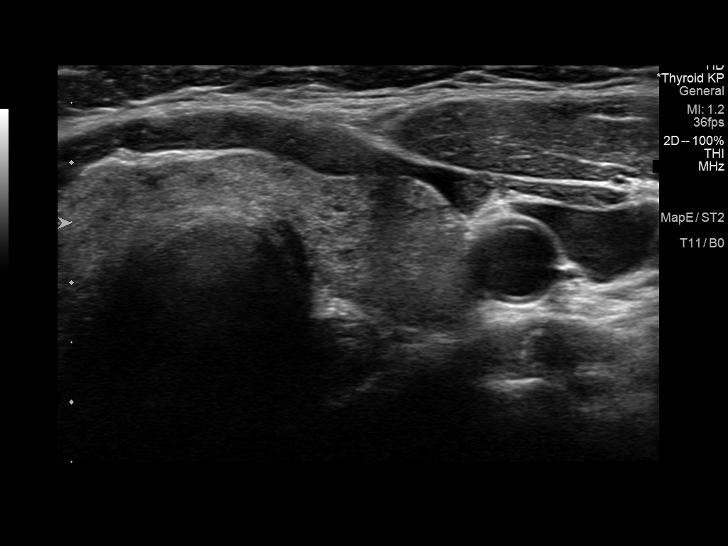
[im 34/51]
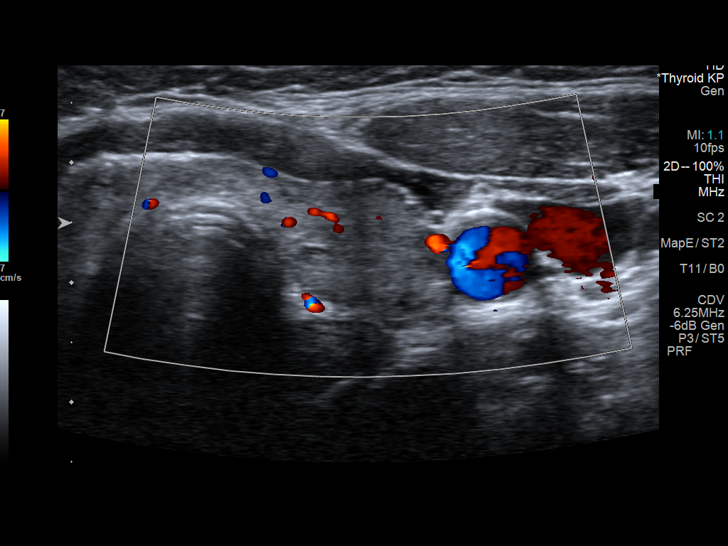
[im 38/51]
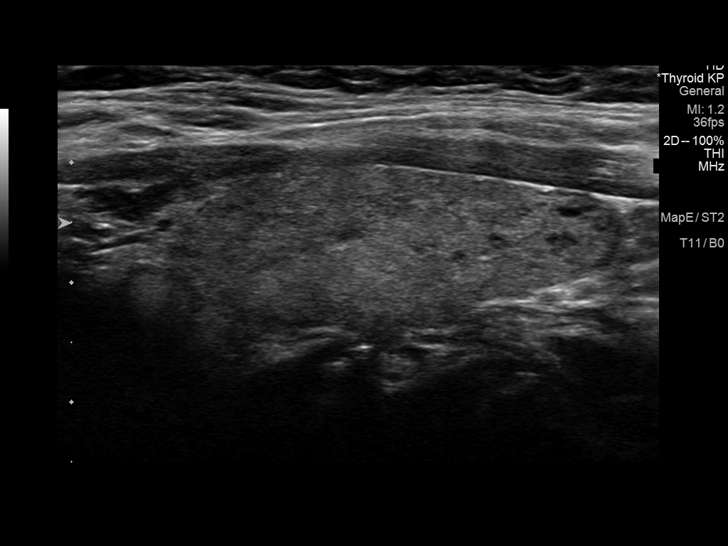
[im 42/51]
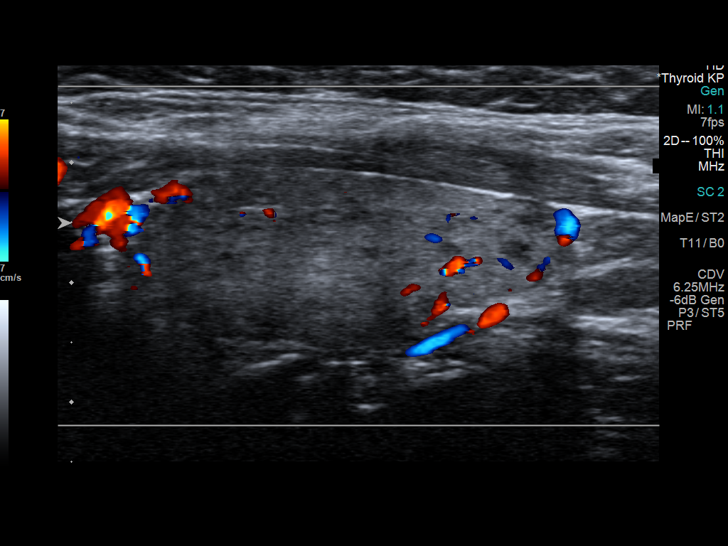
[im 46/51]
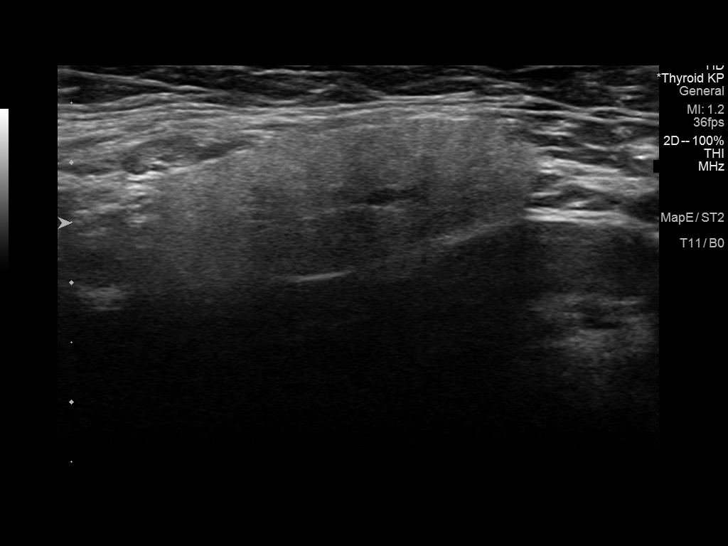
[im 51/51]
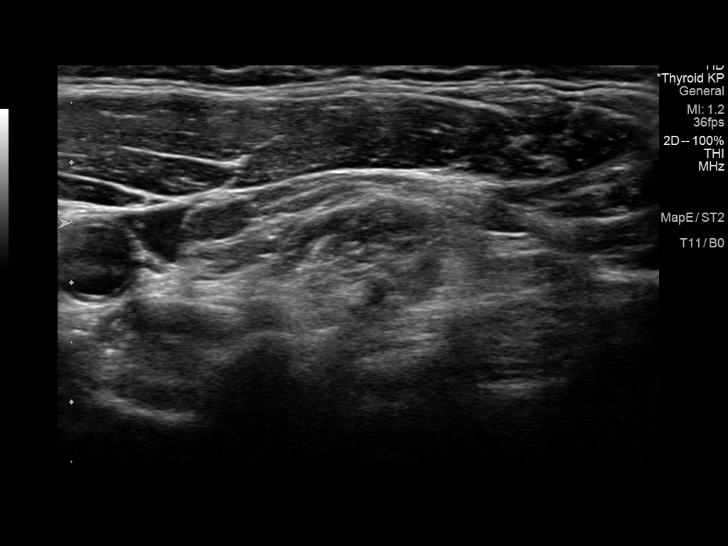

[14 of 25 positions shown; findings below may reference images not displayed]

FINDINGS: Parenchymal Echotexture: Mildly heterogenous

Isthmus: 0.5 cm thickness, previously

Right lobe: 4.3 x 1.6 x 1.7 cm, previously 4.3 x 1.5 x

Left lobe: 3.9 x 1.5 x 1.4 cm, previously 4.1 x 1.5 x

_________________________________________________________

Estimated total number of nodules >/= 1 cm: 0

Number of spongiform nodules >/=  2 cm not described below (TR1): 0

Number of mixed cystic and solid nodules >/= 1.5 cm not described
below (TR2): 0

_________________________________________________________

0.8 cm hypoechoic isthmic nodule, previously 0.9; stability for
greater than 5 years implies benignity; This nodule does NOT meet
TI-RADS criteria for biopsy or dedicated follow-up.
IMPRESSION: 1. Normal-sized thyroid with stable isthmic nodule. No indication
for biopsy or dedicated imaging follow-up.

The above is in keeping with the ACR TI-RADS recommendations - [HOSPITAL] 1296;[DATE].

## 2022-01-12 ENCOUNTER — Other Ambulatory Visit: Payer: Self-pay

## 2022-01-12 ENCOUNTER — Emergency Department
Admission: EM | Admit: 2022-01-12 | Discharge: 2022-01-12 | Disposition: A | Discharge status: Home / Self Care | Payer: 59 | Attending: Family Medicine | Admitting: Family Medicine

## 2022-01-12 DIAGNOSIS — K219 Gastro-esophageal reflux disease without esophagitis: Secondary | ICD-10-CM | POA: Diagnosis not present

## 2022-01-12 HISTORY — DX: Essential (primary) hypertension: I10

## 2022-01-12 MED ORDER — PANTOPRAZOLE SODIUM 40 MG PO TBEC: 40.0000 mg | DELAYED_RELEASE_TABLET | Freq: Every day | ORAL | 0 refills | Status: DC

## 2022-01-12 NOTE — ED Provider Notes (Signed)
[facility]    CSN: [id number] Arrival date & time: 01/12/22  1415      History   Chief Complaint Chief Complaint  Patient presents with   Abdominal Pain    HPI Katrina Nelson is a 56 y.o. female.   HPI 56 year old female presents with upper mid abdominal pain and fullness for 2 days.  Reports discomfort of rising chest causing belching.  Patient reports not taking Protonix as prescribed.  Patient reports history of panic attacks and had to call EMS 2 days ago, reports EKG was normal.  PMH significant for GERD, HTN, and obesity.  Patient is accompanied by her daughter this afternoon.  Past Medical History:  Diagnosis Date   Anxiety    GERD (gastroesophageal reflux disease)    Hypertension    Obesity     There are no problems to display for this patient.   Past Surgical History:  Procedure Laterality Date   APPENDECTOMY     CHOLECYSTECTOMY     OVARY SURGERY      OB History   No obstetric history on file.      Home Medications    Prior to Admission medications   Medication Sig Start Date End Date Taking? Authorizing Provider  pantoprazole (PROTONIX) 40 MG tablet Take 40 mg by mouth daily.   Yes [provider]  pantoprazole (PROTONIX) 40 MG tablet Take 1 tablet (40 mg total) by mouth daily. 01/12/22 02/11/22 Yes Trevor Iha, FNP  Ascorbic Acid (VITAMIN C) 100 MG tablet Take 100 mg by mouth daily.    [provider]  BLACK CURRANT SEED OIL PO Take by mouth.    [provider]  cholecalciferol (VITAMIN D) 1000 UNITS tablet Take 1,000 Units by mouth daily.    [provider]  clonazePAM (KLONOPIN) 1 MG tablet Take 1 tablet (1 mg total) by mouth 2 (two) times daily as needed for anxiety. 11/05/19   Molpus, John, MD  fish oil-omega-3 fatty acids 1000 MG capsule Take 2 g by mouth daily.      [provider]  flunisolide (NASALIDE) 25 MCG/ACT (0.025%) SOLN Place 2 sprays into the nose 2 (two) times daily. 09/10/16    Lattie Haw, MD  levothyroxine (SYNTHROID) 50 MCG tablet Take 50 mcg by mouth every morning. 07/25/21   [provider]  metoprolol succinate (TOPROL-XL) 25 MG 24 hr tablet Take 25 mg by mouth daily. 07/25/21   [provider]  sertraline (ZOLOFT) 100 MG tablet Take 100 mg by mouth.  04/13/16   [provider]  thyroid (ARMOUR) 60 MG tablet Take 60 mg by mouth daily.    [provider]  potassium chloride SA (K-DUR,KLOR-CON) 20 MEQ tablet Take 1 tablet (20 mEq total) by mouth daily. 05/20/16 11/05/19  Earley Favor, NP  ranitidine (ZANTAC) 150 MG tablet Take 1 tablet (150 mg total) by mouth 2 (two) times daily. 09/10/16 11/05/19  Lattie Haw, MD    Family History Family History  Problem Relation Age of Onset   Alcoholism Mother    Healthy Father     Social History Social History   Tobacco Use   Smoking status: Never   Smokeless tobacco: Never  Vaping Use   Vaping Use: Never used  Substance Use Topics   Alcohol use: No   Drug use: No     Allergies   Demerol, Ketorolac tromethamine, and Penicillins   Review of Systems Review of Systems  Gastrointestinal:  Upper abdominal pain, belching x 2 days  All other systems reviewed and are negative.    Physical Exam Triage Vital Signs ED Triage Vitals  Enc Vitals Group     BP 01/12/22 1435 128/82     Pulse Rate 01/12/22 1435 93     Resp 01/12/22 1435 20     Temp 01/12/22 1435 98.2 F (36.8 C)     Temp Source 01/12/22 1435 Oral     SpO2 01/12/22 1435 97 %     Weight 01/12/22 1434 300 lb (136.1 kg)     Height 01/12/22 1434 5\' 8"  (1.727 m)     Head Circumference --      Peak Flow --      Pain Score 01/12/22 1433 3     Pain Loc --      Pain Edu? --      Excl. in GC? --    No data found.  Updated Vital Signs BP 128/82 (BP Location: Left Arm)   Pulse 93   Temp 98.2 F (36.8 C) (Oral)   Resp 20   Ht 5\' 8"  (1.727 m)   Wt 300 lb (136.1 kg)   LMP 08/29/2011   SpO2 97%    BMI 45.61 kg/m      Physical Exam Vitals and nursing note reviewed.  Constitutional:      General: She is not in acute distress.    Appearance: She is well-developed. She is obese. She is not ill-appearing.  HENT:     Head: Normocephalic and atraumatic.     Mouth/Throat:     Mouth: Mucous membranes are moist.     Pharynx: Oropharynx is clear.  Eyes:     Extraocular Movements: Extraocular movements intact.     Pupils: Pupils are equal, round, and reactive to light.  Cardiovascular:     Rate and Rhythm: Normal rate and regular rhythm.     Heart sounds: Normal heart sounds. No murmur heard. Pulmonary:     Effort: Pulmonary effort is normal.     Breath sounds: Normal breath sounds. No wheezing, rhonchi or rales.  Abdominal:     General: Bowel sounds are normal. There is no distension.     Palpations: Abdomen is soft. There is no shifting dullness, fluid wave, hepatomegaly, splenomegaly, mass or pulsatile mass.     Tenderness: There is abdominal tenderness in the epigastric area. There is no right CVA tenderness, left CVA tenderness, guarding or rebound. Negative signs include Murphy's sign, Rovsing's sign and McBurney's sign.     Hernia: No hernia is present.  Skin:    General: Skin is warm and dry.  Neurological:     General: No focal deficit present.     Mental Status: She is alert and oriented to person, place, and time.      UC Treatments / Results  Labs (all labs ordered are listed, but only abnormal results are displayed) Labs Reviewed - No data to display  EKG   Radiology No results found.  Procedures Procedures (including critical care time)  Medications Ordered in UC Medications - No data to display  Initial Impression / Assessment and Plan / UC Course  I have reviewed the triage vital signs and the nursing notes.  Pertinent labs & imaging results that were available during my care of the patient were reviewed by me and considered in my medical decision  making (see chart for details).     MDM: 1.  GERD, unspecified whether esophagitis is present-Rx'd  Pantoprazole. Advised/encouraged patient to take Pantoprazole daily as prescribed.  Advised patient to take this medication on empty stomach with 8 ounces of water and no other medications or food/beverages for 45 minutes.  Advised patient if symptoms worsen and/or unresolved please follow-up with PCP or here for further evaluation.  Patient discharged home, hemodynamically stable.  Final Clinical Impressions(s) / UC Diagnoses   Final diagnoses:  Gastroesophageal reflux disease, unspecified whether esophagitis present     Discharge Instructions      Advised/encouraged patient to take Pantoprazole daily as prescribed.  Advised patient to take this medication on empty stomach with 8 ounces of water and no other medications or food/beverages for 45 minutes.  Advised patient if symptoms worsen and/or unresolved please follow-up with PCP or here for further evaluation.     ED Prescriptions     Medication Sig Dispense Auth. Provider   pantoprazole (PROTONIX) 40 MG tablet Take 1 tablet (40 mg total) by mouth daily. 30 tablet Trevor Iha, FNP      PDMP not reviewed this encounter.   Trevor Iha, FNP 01/12/22 [phone]

## 2022-01-12 NOTE — Discharge Instructions (Addendum)
Advised/encouraged patient to take Pantoprazole daily as prescribed.  Advised patient to take this medication on empty stomach with 8 ounces of water and no other medications or food/beverages for 45 minutes.  Advised patient if symptoms worsen and/or unresolved please follow-up with PCP or here for further evaluation.

## 2022-01-12 NOTE — ED Triage Notes (Signed)
Pt presents to Urgent Care with c/o upper mid-abdominal pain and fullness x 2 days. She states she feels as if something is rising in her chest, causing discomfort  and belching. Hx of GERD--supposed to take pantoprazole daily but has not been taking. She states she took pantoprazole yesterday and today. Also reports hx of panic attacks and had to call EMS two days ago--EKG was normal.

## 2022-01-13 ENCOUNTER — Telehealth: Payer: Self-pay | Admitting: Emergency Medicine

## 2022-01-13 NOTE — Telephone Encounter (Signed)
Spoke with patient states that she is doing much better.  Patient is picking up her prescription today, she already had some at home.  Will follow up as planned.

## 2022-07-09 ENCOUNTER — Ambulatory Visit
Admission: EM | Admit: 2022-07-09 | Discharge: 2022-07-09 | Disposition: A | Discharge status: Home / Self Care | Payer: BLUE CROSS/BLUE SHIELD | Attending: Family Medicine | Admitting: Family Medicine

## 2022-07-09 ENCOUNTER — Encounter: Payer: Self-pay | Admitting: Emergency Medicine

## 2022-07-09 DIAGNOSIS — S0501XA Injury of conjunctiva and corneal abrasion without foreign body, right eye, initial encounter: Secondary | ICD-10-CM | POA: Diagnosis not present

## 2022-07-09 DIAGNOSIS — H1131 Conjunctival hemorrhage, right eye: Secondary | ICD-10-CM | POA: Diagnosis not present

## 2022-07-09 MED ORDER — ERYTHROMYCIN 5 MG/GM OP OINT: 1.0000 | TOPICAL_OINTMENT | Freq: Three times a day (TID) | OPHTHALMIC | 0 refills | Status: DC

## 2022-07-09 NOTE — ED Triage Notes (Signed)
Patient states that a button on a coat hit her in the right eye today.  No blurry vision.  Some discomfort.  Denies contacts.  Does wear glasses.

## 2022-07-09 NOTE — ED Provider Notes (Signed)
[facility]    CSN: [id number] Arrival date & time: 07/09/22  1643      History   Chief Complaint Chief Complaint  Patient presents with   Eye Problem    HPI Katrina Nelson is a 57 y.o. female.   HPI Patient accidentally got hit in the eye today with the sleeve of a jacket that had a button on it.  Her eye has been irritated since then.  She does have normal vision.  No pre-existing eye problems.  Past Medical History:  Diagnosis Date   Anxiety    GERD (gastroesophageal reflux disease)    Hypertension    Obesity     There are no problems to display for this patient.   Past Surgical History:  Procedure Laterality Date   APPENDECTOMY     CHOLECYSTECTOMY     OVARY SURGERY      OB History   No obstetric history on file.      Home Medications    Prior to Admission medications   Medication Sig Start Date End Date Taking? Authorizing Provider  Ascorbic Acid (VITAMIN C) 100 MG tablet Take 100 mg by mouth daily.   Yes [provider]  BLACK CURRANT SEED OIL PO Take by mouth.   Yes [provider]  cholecalciferol (VITAMIN D) 1000 UNITS tablet Take 1,000 Units by mouth daily.   Yes [provider]  clonazePAM (KLONOPIN) 1 MG tablet Take 1 tablet (1 mg total) by mouth 2 (two) times daily as needed for anxiety. 11/05/19  Yes Molpus, John, MD  erythromycin ophthalmic ointment Place 1 Application into the left eye 3 (three) times daily. Place a 1/4 inch ribbon of ointment into the lower eyelid. 07/09/22  Yes Raylene Everts, MD  fish oil-omega-3 fatty acids 1000 MG capsule Take 2 g by mouth daily.     Yes [provider]  flunisolide (NASALIDE) 25 MCG/ACT (0.025%) SOLN Place 2 sprays into the nose 2 (two) times daily. 09/10/16  Yes Kandra Nicolas, MD  levothyroxine (SYNTHROID) 50 MCG tablet Take 50 mcg by mouth every morning. 07/25/21  Yes [provider]  metoprolol succinate (TOPROL-XL) 25 MG 24 hr tablet Take 25  mg by mouth daily. 07/25/21  Yes [provider]  pantoprazole (PROTONIX) 40 MG tablet Take 40 mg by mouth daily.   Yes [provider]  sertraline (ZOLOFT) 100 MG tablet Take 100 mg by mouth.  04/13/16  Yes [provider]  thyroid (ARMOUR) 60 MG tablet Take 60 mg by mouth daily.   Yes [provider]  pantoprazole (PROTONIX) 40 MG tablet Take 1 tablet (40 mg total) by mouth daily. 01/12/22 02/11/22  Eliezer Lofts, FNP  potassium chloride SA (K-DUR,KLOR-CON) 20 MEQ tablet Take 1 tablet (20 mEq total) by mouth daily. 05/20/16 11/05/19  Junius Creamer, NP  ranitidine (ZANTAC) 150 MG tablet Take 1 tablet (150 mg total) by mouth 2 (two) times daily. 09/10/16 11/05/19  Kandra Nicolas, MD    Family History Family History  Problem Relation Age of Onset   Alcoholism Mother    Healthy Father     Social History Social History   Tobacco Use   Smoking status: Never   Smokeless tobacco: Never  Vaping Use   Vaping Use: Never used  Substance Use Topics   Alcohol use: No   Drug use: No     Allergies   Demerol, Ketorolac tromethamine, and Penicillins   Review of Systems Review of Systems  See HPI  Physical Exam Triage Vital Signs ED Triage Vitals  Enc Vitals Group     BP 07/09/22 1714 (!) 148/76     Pulse Rate 07/09/22 1714 71     Resp 07/09/22 1714 18     Temp 07/09/22 1714 98.6 F (37 C)     Temp Source 07/09/22 1714 Oral     SpO2 07/09/22 1714 97 %     Weight 07/09/22 1715 300 lb (136.1 kg)     Height 07/09/22 1715 5\' 8"  (1.727 m)     Head Circumference --      Peak Flow --      Pain Score 07/09/22 1715 0     Pain Loc --      Pain Edu? --      Excl. in GC? --    No data found.  Updated Vital Signs BP (!) 148/76 (BP Location: Left Arm)   Pulse 71   Temp 98.6 F (37 C) (Oral)   Resp 18   Ht 5\' 8"  (1.727 m)   Wt 136.1 kg   LMP 08/29/2011   SpO2 97%   BMI 45.61 kg/m   Physical Exam Constitutional:      General: She is not in acute  distress.    Appearance: She is well-developed. She is obese.  HENT:     Head: Normocephalic and atraumatic.  Eyes:     General: Lids are normal. Lids are everted, no foreign bodies appreciated. Vision grossly intact. Gaze aligned appropriately.        Right eye: No foreign body or hordeolum.     Conjunctiva/sclera:     Right eye: Hemorrhage present.     Pupils: Pupils are equal, round, and reactive to light.     Right eye: Corneal abrasion present.     Slit lamp exam:    Right eye: Anterior chamber quiet.      Comments: There is a very fine line of fluorescein uptake seen just at the edge of the hematoma near the iris.  Appears superficial  Cardiovascular:     Rate and Rhythm: Normal rate.  Pulmonary:     Effort: Pulmonary effort is normal. No respiratory distress.  Abdominal:     General: There is no distension.     Palpations: Abdomen is soft.  Musculoskeletal:        General: Normal range of motion.     Cervical back: Normal range of motion.  Skin:    General: Skin is warm and dry.  Neurological:     Mental Status: She is alert.      UC Treatments / Results  Labs (all labs ordered are listed, but only abnormal results are displayed) Labs Reviewed - No data to display  EKG   Radiology No results found.  Procedures Procedures (including critical care time)  Medications Ordered in UC Medications - No data to display  Initial Impression / Assessment and Plan / UC Course  I have reviewed the triage vital signs and the nursing notes.  Pertinent labs & imaging results that were available during my care of the patient were reviewed by me and considered in my medical decision making (see chart for details).    Gust usual healing.  See eye doctor if fails to improve Final Clinical Impressions(s) / UC Diagnoses   Final diagnoses:  Subconjunctival hemorrhage of right eye  Corneal abrasion, right, initial encounter     Discharge Instructions      Use the eye  ointment as often as needed, every 2-4 hours today.  Put a good dose and at bedtime May use cool warm compresses This usually resolves within a couple of days See your eye doctor if it gets worse instead of better   ED Prescriptions     Medication Sig Dispense Auth. Provider   erythromycin ophthalmic ointment Place 1 Application into the left eye 3 (three) times daily. Place a 1/4 inch ribbon of ointment into the lower eyelid. 1 g Raylene Everts, MD      PDMP not reviewed this encounter.   Raylene Everts, MD 07/09/22 [phone]

## 2022-07-09 NOTE — Discharge Instructions (Signed)
Use the eye ointment as often as needed, every 2-4 hours today.  Put a good dose and at bedtime May use cool warm compresses This usually resolves within a couple of days See your eye doctor if it gets worse instead of better

## 2022-07-25 ENCOUNTER — Telehealth: Payer: Self-pay

## 2022-07-25 NOTE — Telephone Encounter (Signed)
Patient called stating that she is a close friend of Terri Piedra, MRN:  [id number], and Olivia Mackie referred her to you as a PCP. I have let Pang know that I have to send a message to Dr Birdie Riddle and get authorization and that we would call her back

## 2022-07-25 NOTE — Telephone Encounter (Signed)
Ok to establish 

## 2022-07-28 NOTE — Telephone Encounter (Signed)
Called patient and scheduled her for 08/29/22 at 2:20

## 2022-08-29 ENCOUNTER — Ambulatory Visit (INDEPENDENT_AMBULATORY_CARE_PROVIDER_SITE_OTHER): Payer: 59 | Admitting: Family Medicine

## 2022-08-29 ENCOUNTER — Encounter: Payer: Self-pay | Admitting: Family Medicine

## 2022-08-29 VITALS — BP 138/80 | HR 68 | Temp 98.7°F | Resp 17 | Ht 68.0 in | Wt 319.2 lb

## 2022-08-29 DIAGNOSIS — E063 Autoimmune thyroiditis: Secondary | ICD-10-CM | POA: Diagnosis not present

## 2022-08-29 DIAGNOSIS — K219 Gastro-esophageal reflux disease without esophagitis: Secondary | ICD-10-CM | POA: Diagnosis not present

## 2022-08-29 DIAGNOSIS — Z124 Encounter for screening for malignant neoplasm of cervix: Secondary | ICD-10-CM

## 2022-08-29 DIAGNOSIS — F419 Anxiety disorder, unspecified: Secondary | ICD-10-CM | POA: Diagnosis not present

## 2022-08-29 DIAGNOSIS — Z1231 Encounter for screening mammogram for malignant neoplasm of breast: Secondary | ICD-10-CM

## 2022-08-29 MED ORDER — LEVOTHYROXINE SODIUM 50 MCG PO TABS: 50.0000 ug | ORAL_TABLET | Freq: Every morning | ORAL | 1 refills | Status: DC

## 2022-08-29 NOTE — Progress Notes (Signed)
   Subjective:    Patient ID: Katrina Nelson, female    DOB: [birth year], 57 y.o.   MRN: [id number]  HPI New to establish.  Previous PCP- [facility].  Needs GYN referral.  Had colonoscopy w/ Dr Loreta Ave at age 71.  Due for mammo  Anxiety- pt is currently on Metoprolol XL 25mg  daily.  Pt feels this does a good job of controlling sxs.  GERD- chronic problem, on Pantoprazole 40mg  daily w/ good control.  Obesity- current BMI 48.54  Tries to eat a low sugar diet, no regular exercise but recently started walking.  No CP, SOB, HA's visual changes, edema.  Hashimoto's- TSH done 2 days 4.46.  not currently on Levothyroxine.  + fatigue   Review of Systems For ROS see HPI     Objective:   Physical Exam Vitals reviewed.  Constitutional:      General: She is not in acute distress.    Appearance: Normal appearance. She is well-developed. She is obese. She is not ill-appearing.  HENT:     Head: Normocephalic and atraumatic.  Eyes:     Conjunctiva/sclera: Conjunctivae normal.     Pupils: Pupils are equal, round, and reactive to light.  Neck:     Thyroid: No thyromegaly.  Cardiovascular:     Rate and Rhythm: Normal rate and regular rhythm.     Heart sounds: Normal heart sounds. No murmur heard. Pulmonary:     Effort: Pulmonary effort is normal. No respiratory distress.     Breath sounds: Normal breath sounds.  Abdominal:     General: There is no distension.     Palpations: Abdomen is soft.     Tenderness: There is no abdominal tenderness.  Musculoskeletal:     Cervical back: Normal range of motion and neck supple.  Lymphadenopathy:     Cervical: No cervical adenopathy.  Skin:    General: Skin is warm and dry.  Neurological:     General: No focal deficit present.     Mental Status: She is alert and oriented to person, place, and time.  Psychiatric:        Mood and Affect: Mood normal.        Behavior: Behavior normal.        Thought Content: Thought content normal.            Assessment & Plan:

## 2022-08-29 NOTE — Patient Instructions (Addendum)
Schedule a lab only visit in 1 month to recheck TSH and Creatinine START the Levothyroxine once daily INCREASE your water intake We'll call you with your GYN referral to get back on track w/ pap and mammo Try and eat a low carb/low sugar diet and get regular physical activity- walking is great! Call with any questions or concerns Stay Safe!  Stay Healthy! Welcome!  We're glad to have you!

## 2022-09-30 ENCOUNTER — Other Ambulatory Visit (INDEPENDENT_AMBULATORY_CARE_PROVIDER_SITE_OTHER): Payer: 59

## 2022-09-30 DIAGNOSIS — E063 Autoimmune thyroiditis: Secondary | ICD-10-CM

## 2022-09-30 LAB — BASIC METABOLIC PANEL
BUN: 15 mg/dL (ref 6–23)
CO2: 28 mEq/L (ref 19–32)
Calcium: 9.1 mg/dL (ref 8.4–10.5)
Chloride: 103 mEq/L (ref 96–112)
Creatinine, Ser: 1.02 mg/dL (ref 0.40–1.20)
GFR: 61.77 mL/min (ref >60.00)
Glucose, Bld: 94 mg/dL (ref 70–99)
Potassium: 4 mEq/L (ref 3.5–5.1)
Sodium: 137 mEq/L (ref 135–145)

## 2022-09-30 LAB — TSH: TSH: 2.8 u[IU]/mL (ref 0.35–5.50)

## 2022-10-01 ENCOUNTER — Telehealth: Payer: Self-pay

## 2022-10-01 NOTE — Telephone Encounter (Signed)
-----   Message from Midge Minium, MD sent at 10/01/2022  7:27 AM EDT ----- Labs are all normal and look great!  No changes at this time

## 2022-10-01 NOTE — Telephone Encounter (Signed)
Pt aware of lab results 

## 2022-11-30 DIAGNOSIS — F419 Anxiety disorder, unspecified: Secondary | ICD-10-CM | POA: Insufficient documentation

## 2022-11-30 DIAGNOSIS — K219 Gastro-esophageal reflux disease without esophagitis: Secondary | ICD-10-CM | POA: Insufficient documentation

## 2022-11-30 DIAGNOSIS — E063 Autoimmune thyroiditis: Secondary | ICD-10-CM | POA: Insufficient documentation

## 2022-11-30 NOTE — Assessment & Plan Note (Signed)
New to provider, ongoing for pt.  Currently on Pantoprazole 40mg  daily w/ good control of sxs.  Reviewed lifestyle and dietary changes that will also help manage sxs.  Pt expressed understanding and is in agreement w/ plan.

## 2022-11-30 NOTE — Assessment & Plan Note (Signed)
New to provider, ongoing for pt.  Had labs done two days ago and TSH was at upper end of normal at 4.46  Given her fatigue, will start low dose Levothyroxine daily and monitor for improvement.  She will return in 1 month for repeat TSH.  Pt expressed understanding and is in agreement w/ plan.

## 2022-11-30 NOTE — Assessment & Plan Note (Signed)
New to provider, ongoing for pt.  Sxs are currently well controlled on Metoprolol XL 25mg  daily.  No changes at this time

## 2022-11-30 NOTE — Assessment & Plan Note (Signed)
New to provider, ongoing for pt.  Current BMI 48.54  She is trying to eat a low sugar diet and recently started walking.  Discussed the importance of daily physical activity and healthy diet.  Will follow.

## 2022-12-16 ENCOUNTER — Ambulatory Visit (INDEPENDENT_AMBULATORY_CARE_PROVIDER_SITE_OTHER): Payer: 59 | Admitting: Family Medicine

## 2022-12-16 ENCOUNTER — Encounter: Payer: Self-pay | Admitting: Family Medicine

## 2022-12-16 VITALS — BP 136/76 | HR 87 | Temp 97.8°F | Resp 18 | Ht 68.0 in | Wt 323.4 lb

## 2022-12-16 DIAGNOSIS — R609 Edema, unspecified: Secondary | ICD-10-CM | POA: Diagnosis not present

## 2022-12-16 DIAGNOSIS — E063 Autoimmune thyroiditis: Secondary | ICD-10-CM

## 2022-12-16 LAB — LIPID PANEL
Cholesterol: 168 mg/dL (ref 0–200)
HDL: 39 mg/dL — ABNORMAL LOW (ref >39.00)
LDL Cholesterol: 108 mg/dL — ABNORMAL HIGH (ref 0–99)
NonHDL: 128.61
Total CHOL/HDL Ratio: 4
Triglycerides: 105 mg/dL (ref 0.0–149.0)
VLDL: 21 mg/dL (ref 0.0–40.0)

## 2022-12-16 LAB — BASIC METABOLIC PANEL
BUN: 16 mg/dL (ref 6–23)
CO2: 26 mEq/L (ref 19–32)
Calcium: 8.8 mg/dL (ref 8.4–10.5)
Chloride: 103 mEq/L (ref 96–112)
Creatinine, Ser: 1.03 mg/dL (ref 0.40–1.20)
GFR: 60.96 mL/min (ref >60.00)
Glucose, Bld: 87 mg/dL (ref 70–99)
Potassium: 4.8 mEq/L (ref 3.5–5.1)
Sodium: 137 mEq/L (ref 135–145)

## 2022-12-16 LAB — TSH: TSH: 2.92 u[IU]/mL (ref 0.35–5.50)

## 2022-12-16 LAB — HEMOGLOBIN A1C: Hgb A1c MFr Bld: 5.8 % (ref 4.6–6.5)

## 2022-12-16 LAB — HEPATIC FUNCTION PANEL
ALT: 23 U/L (ref 0–35)
AST: 29 U/L (ref 0–37)
Albumin: 4 g/dL (ref 3.5–5.2)
Alkaline Phosphatase: 77 U/L (ref 39–117)
Bilirubin, Direct: 0.2 mg/dL (ref 0.0–0.3)
Total Bilirubin: 0.9 mg/dL (ref 0.2–1.2)
Total Protein: 7.8 g/dL (ref 6.0–8.3)

## 2022-12-16 LAB — T4, FREE: Free T4: 0.9 ng/dL (ref 0.60–1.60)

## 2022-12-16 LAB — T3, FREE: T3, Free: 2.8 pg/mL (ref 2.3–4.2)

## 2022-12-16 MED ORDER — FUROSEMIDE 20 MG PO TABS: 20.0000 mg | ORAL_TABLET | Freq: Every day | ORAL | 3 refills | Status: AC

## 2022-12-16 NOTE — Assessment & Plan Note (Signed)
New.  Pt w/ trace edema of both lower legs.  Start Lasix 20mg  daily and monitor for improvement.  Pt expressed understanding and is in agreement w/ plan.

## 2022-12-16 NOTE — Assessment & Plan Note (Signed)
Ongoing issue for pt.  She doesn't feel that Levothyroxine alone is going to help her feel better.  Has ongoing fatigue, joint stiffness.  Check labs and adjust meds prn.

## 2022-12-16 NOTE — Progress Notes (Signed)
   Subjective:    Patient ID: Katrina Nelson, female    DOB: Jan 14, 1966, 57 y.o.   MRN: [id number]  HPI Hashimoto's Thyroiditis- started Levothyroxine in early March.  Pt doesn't feel that Levothyroxine alone is enough to help her feel better.  + fatigue.  Obesity- pt is up 5 lbs since March.  Pt is frustrated.  Pt feels weight is limiting her ability to be active.    Fluid retention- new.  Pt reports she can leave finger prints in her legs when she presses on them.  Some swelling of feet.   Review of Systems For ROS see HPI     Objective:   Physical Exam Vitals reviewed.  Constitutional:      General: She is not in acute distress.    Appearance: Normal appearance. She is well-developed. She is obese.  HENT:     Head: Normocephalic and atraumatic.  Eyes:     Conjunctiva/sclera: Conjunctivae normal.     Pupils: Pupils are equal, round, and reactive to light.  Neck:     Thyroid: No thyromegaly.  Cardiovascular:     Rate and Rhythm: Normal rate and regular rhythm.     Heart sounds: Normal heart sounds. No murmur heard. Pulmonary:     Effort: Pulmonary effort is normal. No respiratory distress.     Breath sounds: Normal breath sounds.  Abdominal:     General: There is no distension.     Palpations: Abdomen is soft.     Tenderness: There is no abdominal tenderness.  Musculoskeletal:     Cervical back: Normal range of motion and neck supple.     Right lower leg: Edema (trace) present.     Left lower leg: Edema (trace) present.  Lymphadenopathy:     Cervical: No cervical adenopathy.  Skin:    General: Skin is warm and dry.  Neurological:     Mental Status: She is alert and oriented to person, place, and time.  Psychiatric:        Behavior: Behavior normal.           Assessment & Plan:

## 2022-12-16 NOTE — Patient Instructions (Signed)
Schedule your complete physical in 6 months We'll notify you of your lab results and make any changes if needed START the Furosemide once daily (fluid pill) Make sure you are drinking LOTS of water.  I know it sounds crazy when you're retaining fluid, but your kidneys need the extra water to offload what you're retaining Try and get regular physical activity Call with any questions or concerns Hang in there!!!

## 2022-12-16 NOTE — Assessment & Plan Note (Signed)
Deteriorated.  Pt is up 5 lbs since March.  Is not exercising at this time, but feels that weight is prohibiting her from being active.  Encouraged low carb diet and discussed aqua exercise.  Will follow.

## 2022-12-17 ENCOUNTER — Telehealth: Payer: Self-pay

## 2022-12-17 NOTE — Telephone Encounter (Signed)
Left pt a VM with lab results  

## 2022-12-17 NOTE — Telephone Encounter (Signed)
-----   Message from Sheliah Hatch, MD sent at 12/17/2022  7:15 AM EDT ----- Labs are stable and look good!  No changes at this time

## 2023-01-15 ENCOUNTER — Encounter: Payer: 59 | Admitting: Obstetrics and Gynecology

## 2023-01-20 ENCOUNTER — Telehealth: Payer: Self-pay

## 2023-01-20 NOTE — Telephone Encounter (Signed)
Called patient for Hospital TOC appt no answer on either number both stated the numbers were unreachable or out of service.

## 2023-03-11 ENCOUNTER — Encounter: Payer: Self-pay | Admitting: Family Medicine

## 2023-03-11 ENCOUNTER — Ambulatory Visit (INDEPENDENT_AMBULATORY_CARE_PROVIDER_SITE_OTHER): Payer: 59 | Admitting: Family Medicine

## 2023-03-11 ENCOUNTER — Telehealth: Payer: Self-pay | Admitting: Family Medicine

## 2023-03-11 VITALS — BP 120/74 | HR 77 | Temp 98.3°F | Resp 18 | Wt 319.4 lb

## 2023-03-11 DIAGNOSIS — R1013 Epigastric pain: Secondary | ICD-10-CM

## 2023-03-11 MED ORDER — OMEPRAZOLE 40 MG PO CPDR: 40.0000 mg | DELAYED_RELEASE_CAPSULE | Freq: Every day | ORAL | 1 refills | Status: DC

## 2023-03-11 NOTE — Progress Notes (Unsigned)
   Subjective:    Patient ID: Katrina Nelson, female    DOB: [birth year], 57 y.o.   MRN: [id number]  HPI Abd pain- sxs started Monday.  Thinks she may have eaten bad chicken.  Got some relief w/ famotidine, pepto, charcoal.  Had severe abd pain- described as a cramping.  Pain was epigastric.  No vomiting.  Had loose stools yesterday and today.  No fever.  No TTP.  Pt reports feeling 'ok' today.  Pt no longer has gallbladder.  Hx of GERD.  Not currently on PPI.   Review of Systems For ROS see HPI     Objective:   Physical Exam Vitals reviewed.  Constitutional:      General: She is not in acute distress.    Appearance: Normal appearance. She is obese. She is not ill-appearing.  HENT:     Head: Normocephalic and atraumatic.     Mouth/Throat:     Mouth: Mucous membranes are moist.  Eyes:     Extraocular Movements: Extraocular movements intact.     Conjunctiva/sclera: Conjunctivae normal.  Cardiovascular:     Rate and Rhythm: Normal rate and regular rhythm.  Pulmonary:     Effort: Pulmonary effort is normal. No respiratory distress.     Breath sounds: No wheezing.  Abdominal:     General: Bowel sounds are normal. There is no distension.     Palpations: Abdomen is soft.     Tenderness: There is no abdominal tenderness. There is no guarding or rebound.  Musculoskeletal:     Cervical back: Neck supple.  Lymphadenopathy:     Cervical: No cervical adenopathy.  Skin:    General: Skin is warm and dry.  Neurological:     General: No focal deficit present.     Mental Status: She is alert and oriented to person, place, and time.  Psychiatric:        Mood and Affect: Mood normal.        Behavior: Behavior normal.        Thought Content: Thought content normal.           Assessment & Plan:  Epigastric pain- new.  Pt has hx of GERD but is not taking her prescribed PPI.  She reports eating chicken, then later spaghetti sauce and then developing severe epigastric pain.  No vomiting,  some loose stools.  No known sick contacts.  Suspect some degree of gastritis as she had relief w/ pepto and famotidine.  Will check labs to assess for underlying biliary or pancreatic issue.  Check for H pylori.  Start scheduled PPI w/ Famotidine prn.  Reviewed dietary and lifestyle modifications to improve sxs. Will follow.

## 2023-03-11 NOTE — Telephone Encounter (Signed)
Caller name: Lakeicha Bruer  On DPR?: Yes  Call back number: [phone] (mobile)  Provider they see: Sheliah Hatch, MD  Reason for call:   Pt ate pork skins spaghetti and ice cream. Has Genella Rife took prescribed med (by another Dr) thinks she food poisoning

## 2023-03-11 NOTE — Telephone Encounter (Signed)
Pt has 1:20 appt today.

## 2023-03-11 NOTE — Patient Instructions (Addendum)
Follow up as needed or as scheduled We'll notify you of your lab results and make any changes if needed START the Omeprazole once daily to decrease acid production AVOID spicy foods, acidic foods (tomatoes, citrus), caffeine, alcohol to let your stomach heal Drink LOTS of water Eat bland, easy foods like bananas, rice, apple sauce, etc Call and reschedule your GYN appt- [phone] Call with any questions or concerns Hang in there!!!

## 2023-03-12 ENCOUNTER — Encounter: Payer: Self-pay | Admitting: Family Medicine

## 2023-03-12 ENCOUNTER — Telehealth: Payer: Self-pay

## 2023-03-12 ENCOUNTER — Telehealth: Payer: Self-pay | Admitting: Family Medicine

## 2023-03-12 LAB — BASIC METABOLIC PANEL
BUN: 11 mg/dL (ref 6–23)
CO2: 26 meq/L (ref 19–32)
Calcium: 9 mg/dL (ref 8.4–10.5)
Chloride: 101 meq/L (ref 96–112)
Creatinine, Ser: 1.07 mg/dL (ref 0.40–1.20)
GFR: 58.14 mL/min — ABNORMAL LOW (ref >60.00)
Glucose, Bld: 73 mg/dL (ref 70–99)
Potassium: 3.7 meq/L (ref 3.5–5.1)
Sodium: 138 meq/L (ref 135–145)

## 2023-03-12 LAB — CBC WITH DIFFERENTIAL/PLATELET
Basophils Absolute: 0.1 10*3/uL (ref 0.0–0.1)
Basophils Relative: 1.4 % (ref 0.0–3.0)
Eosinophils Absolute: 0.1 10*3/uL (ref 0.0–0.7)
Eosinophils Relative: 2.4 % (ref 0.0–5.0)
HCT: 40.2 % (ref 36.0–46.0)
Hemoglobin: 12.9 g/dL (ref 12.0–15.0)
Lymphocytes Relative: 26.5 % (ref 12.0–46.0)
Lymphs Abs: 1.6 10*3/uL (ref 0.7–4.0)
MCHC: 32 g/dL (ref 30.0–36.0)
MCV: 87.2 fl (ref 78.0–100.0)
Monocytes Absolute: 0.4 10*3/uL (ref 0.1–1.0)
Monocytes Relative: 7.2 % (ref 3.0–12.0)
Neutro Abs: 3.8 10*3/uL (ref 1.4–7.7)
Neutrophils Relative %: 62.5 % (ref 43.0–77.0)
Platelets: 273 10*3/uL (ref 150.0–400.0)
RBC: 4.61 Mil/uL (ref 3.87–5.11)
RDW: 14.4 % (ref 11.5–15.5)
WBC: 6.1 10*3/uL (ref 4.0–10.5)

## 2023-03-12 LAB — HEPATIC FUNCTION PANEL
ALT: 19 U/L (ref 0–35)
AST: 16 U/L (ref 0–37)
Albumin: 3.9 g/dL (ref 3.5–5.2)
Alkaline Phosphatase: 81 U/L (ref 39–117)
Bilirubin, Direct: 0.3 mg/dL (ref 0.0–0.3)
Total Bilirubin: 1.4 mg/dL — ABNORMAL HIGH (ref 0.2–1.2)
Total Protein: 7.5 g/dL (ref 6.0–8.3)

## 2023-03-12 LAB — LIPASE: Lipase: 60 U/L — ABNORMAL HIGH (ref 11.0–59.0)

## 2023-03-12 LAB — H. PYLORI ANTIBODY, IGG: H Pylori IgG: NEGATIVE

## 2023-03-12 NOTE — Telephone Encounter (Signed)
Pt called and had a question about the Lipase test and what the increase on the result means. She asked if someone could please call back.

## 2023-03-12 NOTE — Telephone Encounter (Signed)
Patient informed the results are still not back yet and that they will take more time to result

## 2023-03-12 NOTE — Telephone Encounter (Signed)
Is she sore in her abdomen/stomach or somewhere else?  Starting the Omeprazole should improve the pain in a few days (if her soreness is abdominal)

## 2023-03-12 NOTE — Telephone Encounter (Signed)
I have informed pt that she can take some OTC omeprazole .  She states if it gets worse she will come in . She states her stomach is sore from the cramping . States she feels better than she did yesterday

## 2023-03-12 NOTE — Telephone Encounter (Signed)
Pt asking for explination on Lipase, why it would be high what it could mean

## 2023-03-12 NOTE — Telephone Encounter (Signed)
-----   Message from Neena Rhymes sent at 03/12/2023  3:43 PM EDT ----- Overall labs look good!  No evidence of ulcer causing bacteria.  How are you feeling?

## 2023-03-12 NOTE — Telephone Encounter (Signed)
Informed pt of lab results . She states she feeling sore .

## 2023-03-12 NOTE — Telephone Encounter (Signed)
Caller name: Zitong Popko  On DPR?: Yes  Call back number: [phone] (home)  Provider they see: Sheliah Hatch, MD  Reason for call:   Pt would like a call about lab results

## 2023-03-13 NOTE — Telephone Encounter (Signed)
I have explained the message to pt . She Expressed verbal understanding

## 2023-03-13 NOTE — Telephone Encounter (Signed)
Lipase is just 1 point out of range.  Normal is 59 and she is at 60.  This is no cause for concern.  If her levels were much higher, we would be concerned for pancreatitis, but thankfully that is not the case.

## 2023-03-23 ENCOUNTER — Telehealth: Payer: Self-pay | Admitting: Family Medicine

## 2023-03-23 MED ORDER — LEVOTHYROXINE SODIUM 50 MCG PO TABS: 50.0000 ug | ORAL_TABLET | Freq: Every morning | ORAL | 1 refills | Status: DC

## 2023-03-23 NOTE — Telephone Encounter (Signed)
[facility] called and needs a new script for 90 day supply for  Levothyroxine 50 mcg tablet. Please sent to [facility]. They did leave a call back number [phone] for further information.

## 2023-03-23 NOTE — Addendum Note (Signed)
Addended by: Sheliah Hatch on: 03/23/2023 01:42 PM   Modules accepted: Orders

## 2023-03-23 NOTE — Telephone Encounter (Signed)
Prescription sent to pharmacy as requested.

## 2023-03-23 NOTE — Telephone Encounter (Signed)
Tried to call pt but unable to leave vm-

## 2023-03-23 NOTE — Telephone Encounter (Signed)
Please advise 

## 2023-04-23 ENCOUNTER — Other Ambulatory Visit: Payer: Self-pay | Admitting: Family Medicine

## 2023-06-18 ENCOUNTER — Encounter: Payer: 59 | Admitting: Family Medicine

## 2023-07-16 ENCOUNTER — Ambulatory Visit: Payer: 59 | Admitting: Family Medicine

## 2023-07-16 ENCOUNTER — Encounter: Payer: Self-pay | Admitting: Family Medicine

## 2023-07-16 VITALS — BP 130/78 | HR 63 | Temp 97.9°F | Ht 68.0 in | Wt 323.4 lb

## 2023-07-16 DIAGNOSIS — Z202 Contact with and (suspected) exposure to infections with a predominantly sexual mode of transmission: Secondary | ICD-10-CM

## 2023-07-16 DIAGNOSIS — Z1231 Encounter for screening mammogram for malignant neoplasm of breast: Secondary | ICD-10-CM

## 2023-07-16 DIAGNOSIS — Z0000 Encounter for general adult medical examination without abnormal findings: Secondary | ICD-10-CM | POA: Diagnosis not present

## 2023-07-16 DIAGNOSIS — E063 Autoimmune thyroiditis: Secondary | ICD-10-CM

## 2023-07-16 DIAGNOSIS — Z114 Encounter for screening for human immunodeficiency virus [HIV]: Secondary | ICD-10-CM | POA: Diagnosis not present

## 2023-07-16 DIAGNOSIS — Z1159 Encounter for screening for other viral diseases: Secondary | ICD-10-CM | POA: Diagnosis not present

## 2023-07-16 NOTE — Progress Notes (Signed)
   Subjective:    Patient ID: Katrina Nelson, female    DOB: 1965/08/11, 58 y.o.   MRN: [id number]  HPI CPE- pt reports she is UTD on colonoscopy (Dr Loreta Ave), UTD on Tdap, pap.  Due for mammogram.  Patient Care Team    Relationship Specialty Notifications Start End  Sheliah Hatch, MD PCP - General Family Medicine  09/30/22     Health Maintenance  Topic Date Due   HIV Screening  Never done   Hepatitis C Screening  Never done   Colonoscopy  Never done   MAMMOGRAM  Never done   Zoster Vaccines- Shingrix (1 of 2) Never done   INFLUENZA VACCINE  Never done   COVID-19 Vaccine (1 - 2024-25 season) Never done   Cervical Cancer Screening (HPV/Pap Cotest)  05/29/2024   DTaP/Tdap/Td (2 - Td or Tdap) 12/26/2029   HPV VACCINES  Aged Out      Review of Systems Patient reports no vision/ hearing changes, adenopathy,fever, weight change,  persistant/recurrent hoarseness , swallowing issues, chest pain, palpitations, edema, persistant/recurrent cough, hemoptysis, dyspnea (rest/exertional/paroxysmal nocturnal), gastrointestinal bleeding (melena, rectal bleeding), abdominal pain, significant heartburn, bowel changes, GU symptoms (dysuria, hematuria, incontinence), Gyn symptoms (abnormal  bleeding, pain),  syncope, focal weakness, memory loss, numbness & tingling, skin/nail changes, abnormal bruising or bleeding, anxiety, or depression.   + hair loss    Objective:   Physical Exam General Appearance:    Alert, cooperative, no distress, appears stated age, obese  Head:    Normocephalic, without obvious abnormality, atraumatic  Eyes:    PERRL, conjunctiva/corneas clear, EOM's intact both eyes  Ears:    Normal TM's and external ear canals, both ears  Nose:   Nares normal, septum midline, mucosa normal, no drainage    or sinus tenderness  Throat:   Lips, mucosa, and tongue normal; teeth and gums normal  Neck:   Supple, symmetrical, trachea midline, no adenopathy;    Thyroid: no  enlargement/tenderness/nodules  Back:     Symmetric, no curvature, ROM normal, no CVA tenderness  Lungs:     Clear to auscultation bilaterally, respirations unlabored  Chest Wall:    No tenderness or deformity   Heart:    Regular rate and rhythm, S1 and S2 normal, no murmur, rub   or gallop  Breast Exam:    Deferred to mammo  Abdomen:     Soft, non-tender, bowel sounds active all four quadrants,    no masses, no organomegaly  Genitalia:    Deferred to GYN  Rectal:    Extremities:   Extremities normal, atraumatic, no cyanosis or edema  Pulses:   2+ and symmetric all extremities  Skin:   Skin color, texture, turgor normal, no rashes or lesions  Lymph nodes:   Cervical, supraclavicular, and axillary nodes normal  Neurologic:   CNII-XII intact, normal strength, sensation and reflexes    throughout          Assessment & Plan:

## 2023-07-16 NOTE — Assessment & Plan Note (Signed)
Pt's PE WNL w/ exception of BMI.  UTD on colonoscopy, pap, Tdap.  Mammo ordered.  Check labs.  Anticipatory guidance provided.

## 2023-07-16 NOTE — Assessment & Plan Note (Signed)
Stressed need for low carb diet and regular exercise.  Check labs to risk stratify.  Will follow.

## 2023-07-16 NOTE — Assessment & Plan Note (Signed)
Check labs and adjust Levothyroxine PRN.

## 2023-07-16 NOTE — Patient Instructions (Signed)
Follow up in 6 months to recheck thyroid We'll notify you of your lab results and make any changes if needed Continue to work on low carb/low sugar diet and regular exercise- you can do it! We'll call you to schedule your mammo Call with any questions or concerns Stay Safe!  Stay Healthy! Happy New Year!

## 2023-07-17 ENCOUNTER — Telehealth: Payer: Self-pay

## 2023-07-17 ENCOUNTER — Other Ambulatory Visit: Payer: Self-pay

## 2023-07-17 ENCOUNTER — Encounter: Payer: Self-pay | Admitting: Family Medicine

## 2023-07-17 DIAGNOSIS — E039 Hypothyroidism, unspecified: Secondary | ICD-10-CM

## 2023-07-17 DIAGNOSIS — R944 Abnormal results of kidney function studies: Secondary | ICD-10-CM

## 2023-07-17 LAB — HEPATIC FUNCTION PANEL
ALT: 19 U/L (ref 0–35)
AST: 15 U/L (ref 0–37)
Albumin: 4.1 g/dL (ref 3.5–5.2)
Alkaline Phosphatase: 92 U/L (ref 39–117)
Bilirubin, Direct: 0.1 mg/dL (ref 0.0–0.3)
Total Bilirubin: 0.6 mg/dL (ref 0.2–1.2)
Total Protein: 7.5 g/dL (ref 6.0–8.3)

## 2023-07-17 LAB — CBC WITH DIFFERENTIAL/PLATELET
Basophils Absolute: 0.1 10*3/uL (ref 0.0–0.1)
Basophils Relative: 1.8 % (ref 0.0–3.0)
Eosinophils Absolute: 0.2 10*3/uL (ref 0.0–0.7)
Eosinophils Relative: 2.8 % (ref 0.0–5.0)
HCT: 39.4 % (ref 36.0–46.0)
Hemoglobin: 12.7 g/dL (ref 12.0–15.0)
Lymphocytes Relative: 30.3 % (ref 12.0–46.0)
Lymphs Abs: 1.8 10*3/uL (ref 0.7–4.0)
MCHC: 32.1 g/dL (ref 30.0–36.0)
MCV: 87.6 fL (ref 78.0–100.0)
Monocytes Absolute: 0.5 10*3/uL (ref 0.1–1.0)
Monocytes Relative: 9 % (ref 3.0–12.0)
Neutro Abs: 3.3 10*3/uL (ref 1.4–7.7)
Neutrophils Relative %: 56.1 % (ref 43.0–77.0)
Platelets: 251 10*3/uL (ref 150.0–400.0)
RBC: 4.5 Mil/uL (ref 3.87–5.11)
RDW: 13.9 % (ref 11.5–15.5)
WBC: 5.9 10*3/uL (ref 4.0–10.5)

## 2023-07-17 LAB — LIPID PANEL
Cholesterol: 165 mg/dL (ref 0–200)
HDL: 45.1 mg/dL (ref >39.00)
LDL Cholesterol: 101 mg/dL — ABNORMAL HIGH (ref 0–99)
NonHDL: 119.65
Total CHOL/HDL Ratio: 4
Triglycerides: 95 mg/dL (ref 0.0–149.0)
VLDL: 19 mg/dL (ref 0.0–40.0)

## 2023-07-17 LAB — T3, FREE: T3, Free: 3.3 pg/mL (ref 2.3–4.2)

## 2023-07-17 LAB — BASIC METABOLIC PANEL
BUN: 17 mg/dL (ref 6–23)
CO2: 32 meq/L (ref 19–32)
Calcium: 9 mg/dL (ref 8.4–10.5)
Chloride: 101 meq/L (ref 96–112)
Creatinine, Ser: 1.14 mg/dL (ref 0.40–1.20)
GFR: 53.76 mL/min — ABNORMAL LOW (ref >60.00)
Glucose, Bld: 84 mg/dL (ref 70–99)
Potassium: 4.3 meq/L (ref 3.5–5.1)
Sodium: 139 meq/L (ref 135–145)

## 2023-07-17 LAB — VITAMIN D 25 HYDROXY (VIT D DEFICIENCY, FRACTURES): VITD: 57.34 ng/mL (ref 30.00–100.00)

## 2023-07-17 LAB — HEMOGLOBIN A1C: Hgb A1c MFr Bld: 5.9 % (ref 4.6–6.5)

## 2023-07-17 LAB — RPR: RPR Ser Ql: NONREACTIVE

## 2023-07-17 LAB — T4, FREE: Free T4: 1.06 ng/dL (ref 0.60–1.60)

## 2023-07-17 LAB — HEPATITIS C ANTIBODY: Hepatitis C Ab: NONREACTIVE

## 2023-07-17 LAB — TSH: TSH: 5.82 u[IU]/mL — ABNORMAL HIGH (ref 0.35–5.50)

## 2023-07-17 LAB — HIV ANTIBODY (ROUTINE TESTING W REFLEX): HIV 1&2 Ab, 4th Generation: NONREACTIVE

## 2023-07-17 MED ORDER — LEVOTHYROXINE SODIUM 75 MCG PO TABS: 75.0000 ug | ORAL_TABLET | Freq: Every day | ORAL | 3 refills | Status: DC

## 2023-07-17 NOTE — Telephone Encounter (Signed)
Called pt unable to leave a vm. See phone note. Pt need lab only visit 1 month. She has read her labs via MyChart

## 2023-07-17 NOTE — Telephone Encounter (Signed)
Copied from CRM [id number]. Topic: General - Other >> Jul 17, 2023  4:34 PM Florestine Avers wrote: Reason for CRM: Patient called in stating that she had a few missed calls from the office with no voicemail's. Patient requesting a call back.

## 2023-07-17 NOTE — Progress Notes (Signed)
Sent pt a message via MyChart

## 2023-07-20 LAB — NUSWAB VAGINITIS PLUS (VG+)
Candida albicans, NAA: NEGATIVE
Candida glabrata, NAA: NEGATIVE
Chlamydia trachomatis, NAA: NEGATIVE
Neisseria gonorrhoeae, NAA: NEGATIVE
Trich vag by NAA: NEGATIVE

## 2023-07-20 NOTE — Telephone Encounter (Signed)
Lab visit has been made

## 2023-07-20 NOTE — Telephone Encounter (Signed)
Copied from CRM [id number]. Topic: General - Call Back - No Documentation >> Jul 17, 2023  5:11 PM Corin V wrote: Reason for CRM: Patient returned call to office. Per note in chart "Called pt unable to leave a vm. See phone note. Pt need lab only visit 1 month." Agent unable to determine if patient needs to schedule a lab visit now, in one month, or if they only needed the recent lab visit. Please call patient back for clarification to be provided.

## 2023-08-17 ENCOUNTER — Other Ambulatory Visit (INDEPENDENT_AMBULATORY_CARE_PROVIDER_SITE_OTHER): Payer: 59

## 2023-08-17 DIAGNOSIS — E039 Hypothyroidism, unspecified: Secondary | ICD-10-CM | POA: Diagnosis not present

## 2023-08-17 DIAGNOSIS — R944 Abnormal results of kidney function studies: Secondary | ICD-10-CM | POA: Diagnosis not present

## 2023-08-17 LAB — BASIC METABOLIC PANEL
BUN: 10 mg/dL (ref 6–23)
CO2: 29 meq/L (ref 19–32)
Calcium: 8.6 mg/dL (ref 8.4–10.5)
Chloride: 101 meq/L (ref 96–112)
Creatinine, Ser: 1.01 mg/dL (ref 0.40–1.20)
GFR: 62.12 mL/min (ref >60.00)
Glucose, Bld: 95 mg/dL (ref 70–99)
Potassium: 4.6 meq/L (ref 3.5–5.1)
Sodium: 138 meq/L (ref 135–145)

## 2023-08-17 LAB — TSH: TSH: 1.91 u[IU]/mL (ref 0.35–5.50)

## 2023-08-18 ENCOUNTER — Telehealth: Payer: Self-pay

## 2023-08-18 ENCOUNTER — Encounter: Payer: Self-pay | Admitting: Family Medicine

## 2023-08-18 NOTE — Telephone Encounter (Signed)
 Pt has reviewed labs via MyChart

## 2023-08-18 NOTE — Telephone Encounter (Signed)
-----   Message from Neena Rhymes sent at 08/18/2023  7:54 AM EST ----- Labs look great!  No changes at this time

## 2023-09-09 ENCOUNTER — Ambulatory Visit: Payer: 59

## 2023-10-01 ENCOUNTER — Ambulatory Visit

## 2023-10-18 ENCOUNTER — Other Ambulatory Visit: Payer: Self-pay | Admitting: Family Medicine

## 2023-10-18 DIAGNOSIS — E039 Hypothyroidism, unspecified: Secondary | ICD-10-CM

## 2024-01-14 ENCOUNTER — Ambulatory Visit: Payer: 59 | Admitting: Family Medicine

## 2024-02-12 ENCOUNTER — Ambulatory Visit: Admitting: Family Medicine

## 2024-02-20 ENCOUNTER — Other Ambulatory Visit: Payer: Self-pay

## 2024-02-20 ENCOUNTER — Encounter (HOSPITAL_BASED_OUTPATIENT_CLINIC_OR_DEPARTMENT_OTHER): Payer: Self-pay | Admitting: Emergency Medicine

## 2024-02-20 ENCOUNTER — Emergency Department (HOSPITAL_BASED_OUTPATIENT_CLINIC_OR_DEPARTMENT_OTHER)

## 2024-02-20 ENCOUNTER — Emergency Department (HOSPITAL_BASED_OUTPATIENT_CLINIC_OR_DEPARTMENT_OTHER): Admission: EM | Admit: 2024-02-20 | Discharge: 2024-02-20 | Disposition: A | Discharge status: Home / Self Care

## 2024-02-20 DIAGNOSIS — K219 Gastro-esophageal reflux disease without esophagitis: Secondary | ICD-10-CM | POA: Insufficient documentation

## 2024-02-20 DIAGNOSIS — R002 Palpitations: Secondary | ICD-10-CM | POA: Insufficient documentation

## 2024-02-20 LAB — COMPREHENSIVE METABOLIC PANEL WITH GFR
ALT: 26 U/L (ref 0–44)
AST: 26 U/L (ref 15–41)
Albumin: 4.6 g/dL (ref 3.5–5.0)
Alkaline Phosphatase: 91 U/L (ref 38–126)
Anion gap: 14 (ref 5–15)
BUN: 13 mg/dL (ref 6–20)
CO2: 25 mmol/L (ref 22–32)
Calcium: 9.4 mg/dL (ref 8.9–10.3)
Chloride: 97 mmol/L — ABNORMAL LOW (ref 98–111)
Creatinine, Ser: 1.11 mg/dL — ABNORMAL HIGH (ref 0.44–1.00)
GFR, Estimated: 58 mL/min — ABNORMAL LOW (ref >60)
Glucose, Bld: 98 mg/dL (ref 70–99)
Potassium: 3.5 mmol/L (ref 3.5–5.1)
Sodium: 135 mmol/L (ref 135–145)
Total Bilirubin: 1.1 mg/dL (ref 0.0–1.2)
Total Protein: 8.2 g/dL — ABNORMAL HIGH (ref 6.5–8.1)

## 2024-02-20 LAB — TROPONIN T, HIGH SENSITIVITY
Troponin T High Sensitivity: <15 ng/L (ref 0–19)
Troponin T High Sensitivity: <15 ng/L (ref 0–19)

## 2024-02-20 LAB — CBC WITH DIFFERENTIAL/PLATELET
Abs Immature Granulocytes: 0.03 K/uL (ref 0.00–0.07)
Basophils Absolute: 0.1 K/uL (ref 0.0–0.1)
Basophils Relative: 1 %
Eosinophils Absolute: 0.2 K/uL (ref 0.0–0.5)
Eosinophils Relative: 4 %
HCT: 41.9 % (ref 36.0–46.0)
Hemoglobin: 13.6 g/dL (ref 12.0–15.0)
Immature Granulocytes: 1 %
Lymphocytes Relative: 37 %
Lymphs Abs: 2.3 K/uL (ref 0.7–4.0)
MCH: 27.9 pg (ref 26.0–34.0)
MCHC: 32.5 g/dL (ref 30.0–36.0)
MCV: 85.9 fL (ref 80.0–100.0)
Monocytes Absolute: 0.5 K/uL (ref 0.1–1.0)
Monocytes Relative: 9 %
Neutro Abs: 3 K/uL (ref 1.7–7.7)
Neutrophils Relative %: 48 %
Platelets: 266 K/uL (ref 150–400)
RBC: 4.88 MIL/uL (ref 3.87–5.11)
RDW: 13.5 % (ref 11.5–15.5)
WBC: 6.1 K/uL (ref 4.0–10.5)
nRBC: 0 % (ref 0.0–0.2)

## 2024-02-20 LAB — TSH: TSH: 2.11 u[IU]/mL (ref 0.350–4.500)

## 2024-02-20 MED ORDER — ALUM & MAG HYDROXIDE-SIMETH 200-200-20 MG/5ML PO SUSP
30.0000 mL | Freq: Once | ORAL | Status: AC
  Administered 2024-02-20: 30 mL via ORAL
  Filled 2024-02-20: qty 30

## 2024-02-20 NOTE — Discharge Instructions (Signed)
 As we discussed, you should take your Prilosec everyday for symptoms of persistent cough.   Your thyroid  study will not result in the ED tonight. Please plan to see your doctor early next week for review of this test and whether your palpitations and cough persist.   Return to the ED with any new or concerning symptoms at any time.

## 2024-02-20 NOTE — ED Notes (Signed)
 ED Provider at bedside.

## 2024-02-20 NOTE — ED Triage Notes (Signed)
 Pt c/o palpitations since drinking a soda yesterday; denies pain

## 2024-02-20 NOTE — ED Provider Notes (Signed)
 [facility] Provider Note   CSN: [id number] Arrival date & time: 02/20/24  1609     Patient presents with: Palpitations   Katrina Nelson is a 58 y.o. female.   Patient to ED for evaluation of palpitations today described as skipping beats. She denies pain, SOB. She is coughing frequently, dry cough. No fever or congestion. She reports history of thyroid  disease and similar symptoms when her thyroid  levels are abnormal. She reports similar symptoms last week that resolved. No nausea, vomiting. She states when she gets nervous as she is now, she urinates frequently but denies dysuria or abdominal pain.  The history is provided by the patient. No language interpreter was used.  Palpitations      Prior to Admission medications   Medication Sig Start Date End Date Taking? Authorizing Provider  Ascorbic Acid (VITAMIN C) 100 MG tablet Take 100 mg by mouth daily.    [provider]  BLACK CURRANT SEED OIL PO Take by mouth.    [provider]  cholecalciferol (VITAMIN D ) 1000 UNITS tablet Take 1,000 Units by mouth daily.    [provider]  clonazePAM  (KLONOPIN ) 1 MG tablet Take 1 tablet (1 mg total) by mouth 2 (two) times daily as needed for anxiety. 11/05/19   Molpus, John, MD  famotidine (PEPCID) 20 MG tablet Take by mouth. 01/18/23   [provider]  fish oil-omega-3 fatty acids 1000 MG capsule Take 2 g by mouth daily.      [provider]  furosemide  (LASIX ) 20 MG tablet Take 1 tablet (20 mg total) by mouth daily. 12/16/22   Tabori, Katherine E, MD  levothyroxine  (SYNTHROID ) 75 MCG tablet TAKE 1 TABLET BY MOUTH EVERY DAY 10/19/23   Tabori, Katherine E, MD  linaclotide [facility]) 145 MCG CAPS capsule Take 145 mcg by mouth. 12/09/18   [provider]  metoprolol succinate (TOPROL-XL) 25 MG 24 hr tablet Take 25 mg by mouth daily. 07/25/21   [provider]  omeprazole  (PRILOSEC) 40 MG  capsule Take 1 capsule (40 mg total) by mouth daily. 03/11/23   Tabori, Katherine E, MD  progesterone (PROMETRIUM) 100 MG capsule Take 100 mg by mouth daily. 03/07/23   [provider]  potassium chloride  SA (K-DUR,KLOR-CON ) 20 MEQ tablet Take 1 tablet (20 mEq total) by mouth daily. 05/20/16 11/05/19  Terryl Kubas, NP  ranitidine  (ZANTAC ) 150 MG tablet Take 1 tablet (150 mg total) by mouth 2 (two) times daily. 09/10/16 11/05/19  Pauline Garnette LABOR, MD    Allergies: Demerol, Ketorolac tromethamine, and Penicillins    Review of Systems  Cardiovascular:  Positive for palpitations.    Updated Vital Signs BP 136/81   Pulse 69   Temp 98.3 F (36.8 C) (Oral)   Resp 18   Ht 5' 8 (1.727 m)   Wt (!) 146.7 kg   LMP 08/29/2011   SpO2 98%   BMI 49.18 kg/m   Physical Exam Vitals and nursing note reviewed.  Constitutional:      Appearance: She is well-developed.  HENT:     Head: Normocephalic.  Neck:     Vascular: No carotid bruit.  Cardiovascular:     Rate and Rhythm: Normal rate and regular rhythm.     Heart sounds: No murmur heard. Pulmonary:     Effort: Pulmonary effort is normal.     Breath sounds: Normal breath sounds. No wheezing, rhonchi or rales.  Abdominal:     General: Bowel sounds  are normal.     Palpations: Abdomen is soft.     Tenderness: There is no abdominal tenderness. There is no guarding or rebound.  Musculoskeletal:        General: Normal range of motion.     Cervical back: Normal range of motion and neck supple.     Right lower leg: No edema.     Left lower leg: No edema.  Skin:    General: Skin is warm and dry.  Neurological:     General: No focal deficit present.     Mental Status: She is alert and oriented to person, place, and time.     (all labs ordered are listed, but only abnormal results are displayed) Labs Reviewed  COMPREHENSIVE METABOLIC PANEL WITH GFR - Abnormal; Notable for the following components:      Result Value   Chloride 97 (*)     Creatinine, Ser 1.11 (*)    Total Protein 8.2 (*)    GFR, Estimated 58 (*)    All other components within normal limits  CBC WITH DIFFERENTIAL/PLATELET  TSH  TROPONIN T, HIGH SENSITIVITY  TROPONIN T, HIGH SENSITIVITY    EKG: EKG Interpretation Date/Time:  Saturday February 20 2024 16:20:41 EDT Ventricular Rate:  76 PR Interval:  195 QRS Duration:  86 QT Interval:  376 QTC Calculation: 423 R Axis:   35  Text Interpretation: Sinus rhythm Normal intervals No STEMI Compared with previous EKG from 11/05/2019 Confirmed by Gennaro Bouchard ([id number]) on 02/20/2024 4:28:26 PM  Radiology: DG Chest Portable 1 View Result Date: 02/20/2024 CLINICAL DATA:  Palpitations and chest discomfort. EXAM: PORTABLE CHEST 1 VIEW COMPARISON:  January 18, 2023 FINDINGS: The heart size and mediastinal contours are within normal limits. No acute infiltrate, pleural effusion or pneumothorax is identified. The visualized skeletal structures are unremarkable. IMPRESSION: No active disease. Electronically Signed   By: Suzen Dials M.D.   On: 02/20/2024 18:18     Procedures   Medications Ordered in the ED  alum & mag hydroxide-simeth (MAALOX/MYLANTA) 200-200-20 MG/5ML suspension 30 mL (30 mLs Oral Given 02/20/24 1813)    Clinical Course as of 02/20/24 1922  Sat Feb 20, 2024  1647 Patient to ED with symptoms of skipping beats palpitations earlier today, none now. She also reports a cough that is frequent, associated with inspiration. No chest pain, near syncope, dizziness.  [SU]  1920 Labs are reassuring. Thyroid  collected but not expected to result tonight. This will be reviewed in follow up with PCP next week. No further palpitations in the ED. EKG unremarkable. GI cocktail provided and there is some improvement in the cough suggesting element of reflux as contributing factor.   Patient and spouse reassured. She is stable for discharge home and is strongly encouraged to see her doctor in office follow up next week.   [SU]    Clinical Course User Index [SU] Odell Balls, PA-C                                 Medical Decision Making Amount and/or Complexity of Data Reviewed Labs: ordered. Radiology: ordered.  Risk OTC drugs.        Final diagnoses:  Palpitations  Gastroesophageal reflux disease without esophagitis    ED Discharge Orders     None          Odell Balls, PA-C 02/20/24 [address], Megan L, DO 02/21/24 1531

## 2024-02-22 DIAGNOSIS — K5904 Chronic idiopathic constipation: Secondary | ICD-10-CM | POA: Insufficient documentation

## 2024-02-22 DIAGNOSIS — K600 Acute anal fissure: Secondary | ICD-10-CM | POA: Insufficient documentation

## 2024-02-22 DIAGNOSIS — R1013 Epigastric pain: Secondary | ICD-10-CM | POA: Insufficient documentation

## 2024-02-22 DIAGNOSIS — R635 Abnormal weight gain: Secondary | ICD-10-CM | POA: Insufficient documentation

## 2024-02-22 DIAGNOSIS — R140 Abdominal distension (gaseous): Secondary | ICD-10-CM | POA: Insufficient documentation

## 2024-02-22 DIAGNOSIS — R142 Eructation: Secondary | ICD-10-CM | POA: Insufficient documentation

## 2024-02-22 DIAGNOSIS — K625 Hemorrhage of anus and rectum: Secondary | ICD-10-CM | POA: Insufficient documentation

## 2024-02-23 ENCOUNTER — Ambulatory Visit: Admitting: Family Medicine

## 2024-03-01 ENCOUNTER — Encounter: Payer: Self-pay | Admitting: Family Medicine

## 2024-03-02 ENCOUNTER — Ambulatory Visit: Admitting: Family Medicine

## 2024-03-13 ENCOUNTER — Other Ambulatory Visit: Payer: Self-pay | Admitting: Family Medicine

## 2024-03-14 ENCOUNTER — Ambulatory Visit (INDEPENDENT_AMBULATORY_CARE_PROVIDER_SITE_OTHER): Admitting: Family Medicine

## 2024-03-14 ENCOUNTER — Encounter: Payer: Self-pay | Admitting: Family Medicine

## 2024-03-14 DIAGNOSIS — K219 Gastro-esophageal reflux disease without esophagitis: Secondary | ICD-10-CM

## 2024-03-14 DIAGNOSIS — R609 Edema, unspecified: Secondary | ICD-10-CM | POA: Diagnosis not present

## 2024-03-14 DIAGNOSIS — R002 Palpitations: Secondary | ICD-10-CM | POA: Diagnosis not present

## 2024-03-14 MED ORDER — ZEPBOUND 2.5 MG/0.5ML ~~LOC~~ SOAJ: 2.5000 mg | SUBCUTANEOUS | 1 refills | Status: DC

## 2024-03-14 NOTE — Progress Notes (Unsigned)
   Subjective:    Patient ID: Katrina Nelson, female    DOB: Jun 11, 1966, 58 y.o.   MRN: [id number]  HPI Obesity- pt's BMI 48.24 and weight 317 lbs.  She is down 6 lbs.  She would like to lose more weight but finds that she is struggling w/ additional weight loss.  Fluid retention- pt reports L ankle swells particularly if she is on her feet for long periods of time.  Palpitations- pt was seen in ER on 8/23 states she has not had palpitations since.  GERD- pt reports she has epigastric pain, burning particularly when she drinks coffee.  This causes her to cough.  GI cocktail resolved sxs.  Has been taking Mylanta since which is not as effective.   Review of Systems For ROS see HPI     Objective:   Physical Exam Vitals reviewed.  Constitutional:      General: She is not in acute distress.    Appearance: Normal appearance. She is well-developed. She is obese. She is not ill-appearing.  HENT:     Head: Normocephalic and atraumatic.  Eyes:     Conjunctiva/sclera: Conjunctivae normal.     Pupils: Pupils are equal, round, and reactive to light.  Neck:     Thyroid : No thyromegaly.  Cardiovascular:     Rate and Rhythm: Normal rate and regular rhythm.     Pulses: Normal pulses.     Heart sounds: Normal heart sounds. No murmur heard. Pulmonary:     Effort: Pulmonary effort is normal. No respiratory distress.     Breath sounds: Normal breath sounds.  Abdominal:     General: There is no distension.     Palpations: Abdomen is soft.     Tenderness: There is no abdominal tenderness.  Musculoskeletal:     Cervical back: Normal range of motion and neck supple.     Right lower leg: No edema.     Left lower leg: No edema.  Lymphadenopathy:     Cervical: No cervical adenopathy.  Skin:    General: Skin is warm and dry.  Neurological:     General: No focal deficit present.     Mental Status: She is alert and oriented to person, place, and time.  Psychiatric:        Mood and Affect: Mood  normal.        Behavior: Behavior normal.           Assessment & Plan:  Palpitations- resolved since ER visit on 8/23

## 2024-03-14 NOTE — Patient Instructions (Signed)
 Follow up in 6 weeks to recheck weight loss progress We'll notify you of your lab results and make any changes if needed START the weekly Tirzepatide  injections to help w/ weight loss Continue to work on healthy diet and regular exercise- you can do it! RESTART the Omeprazole  daily- I sent a refill to the pharmacy Once we have your lab results, we may restart your fluid pill to help w/ swelling Call with any questions or concerns Hang in there!

## 2024-03-15 ENCOUNTER — Encounter: Payer: Self-pay | Admitting: Family Medicine

## 2024-03-15 ENCOUNTER — Other Ambulatory Visit (HOSPITAL_COMMUNITY): Payer: Self-pay

## 2024-03-15 ENCOUNTER — Encounter: Payer: Self-pay | Admitting: Pharmacy Technician

## 2024-03-15 LAB — CBC WITH DIFFERENTIAL/PLATELET
Basophils Absolute: 0.1 K/uL (ref 0.0–0.1)
Basophils Relative: 1.4 % (ref 0.0–3.0)
Eosinophils Absolute: 0.2 K/uL (ref 0.0–0.7)
Eosinophils Relative: 4.5 % (ref 0.0–5.0)
HCT: 38.7 % (ref 36.0–46.0)
Hemoglobin: 12.5 g/dL (ref 12.0–15.0)
Lymphocytes Relative: 33.1 % (ref 12.0–46.0)
Lymphs Abs: 1.4 K/uL (ref 0.7–4.0)
MCHC: 32.4 g/dL (ref 30.0–36.0)
MCV: 84.9 fl (ref 78.0–100.0)
Monocytes Absolute: 0.4 K/uL (ref 0.1–1.0)
Monocytes Relative: 9 % (ref 3.0–12.0)
Neutro Abs: 2.2 K/uL (ref 1.4–7.7)
Neutrophils Relative %: 52 % (ref 43.0–77.0)
Platelets: 160 K/uL (ref 150.0–400.0)
RBC: 4.56 Mil/uL (ref 3.87–5.11)
RDW: 14.3 % (ref 11.5–15.5)
WBC: 4.3 K/uL (ref 4.0–10.5)

## 2024-03-15 LAB — HEPATIC FUNCTION PANEL
ALT: 18 U/L (ref 0–35)
AST: 20 U/L (ref 0–37)
Albumin: 3.9 g/dL (ref 3.5–5.2)
Alkaline Phosphatase: 61 U/L (ref 39–117)
Bilirubin, Direct: 0.3 mg/dL (ref 0.0–0.3)
Total Bilirubin: 1.4 mg/dL — ABNORMAL HIGH (ref 0.2–1.2)
Total Protein: 7.2 g/dL (ref 6.0–8.3)

## 2024-03-15 LAB — LIPID PANEL
Cholesterol: 144 mg/dL (ref 0–200)
HDL: 32.8 mg/dL — ABNORMAL LOW (ref >39.00)
LDL Cholesterol: 93 mg/dL (ref 0–99)
NonHDL: 111.59
Total CHOL/HDL Ratio: 4
Triglycerides: 95 mg/dL (ref 0.0–149.0)
VLDL: 19 mg/dL (ref 0.0–40.0)

## 2024-03-15 LAB — BASIC METABOLIC PANEL WITH GFR
BUN: 8 mg/dL (ref 6–23)
CO2: 26 meq/L (ref 19–32)
Calcium: 8.9 mg/dL (ref 8.4–10.5)
Chloride: 107 meq/L (ref 96–112)
Creatinine, Ser: 0.94 mg/dL (ref 0.40–1.20)
GFR: 67.44 mL/min (ref >60.00)
Glucose, Bld: 83 mg/dL (ref 70–99)
Potassium: 3.6 meq/L (ref 3.5–5.1)
Sodium: 135 meq/L (ref 135–145)

## 2024-03-15 LAB — BRAIN NATRIURETIC PEPTIDE: Pro B Natriuretic peptide (BNP): 51 pg/mL (ref 0.0–100.0)

## 2024-03-15 LAB — HEMOGLOBIN A1C: Hgb A1c MFr Bld: 6 % (ref 4.6–6.5)

## 2024-03-15 LAB — TSH: TSH: 1.84 u[IU]/mL (ref 0.35–5.50)

## 2024-03-15 LAB — VITAMIN D 25 HYDROXY (VIT D DEFICIENCY, FRACTURES): VITD: 94.22 ng/mL (ref 30.00–100.00)

## 2024-03-15 NOTE — Telephone Encounter (Signed)
 error

## 2024-03-15 NOTE — Assessment & Plan Note (Signed)
 Ongoing issue for pt.  She is down 6 lbs but is frustrated and feels this should be more.  She is interested in trying medication for weight loss.  Will attempt to start Tirzepatide  weekly.  If this is not covered, will refer to Weight Loss clinic.  Pt expressed understanding and is in agreement w/ plan.

## 2024-03-15 NOTE — Assessment & Plan Note (Signed)
 Deteriorated.  Pt reports burning epigastric pain- particularly w/ certain foods and coffee.  This causes her to cough which at times triggers palpitations.  GI cocktail in ED resolved sxs temporarily.  Has been taking Mylanta since d/c but this is not as effective.  Will restart Omeprazole  40mg  daily.  Reviewed lifestyle and dietary choices that will improve sxs.

## 2024-03-15 NOTE — Assessment & Plan Note (Signed)
 Pt has hx of similar.  She is not currently taking her Lasix  but she cannot remember why she stopped.  No obvious swelling noted on exam today.  Will check BNP to assess for fluid overload/cardiac strain.  Depending on lab results will restart diuretic for symptom management.  Pt expressed understanding and is in agreement w/ plan.

## 2024-03-16 ENCOUNTER — Ambulatory Visit: Payer: Self-pay | Admitting: Family Medicine

## 2024-03-16 NOTE — Progress Notes (Signed)
 Pt has concerns able about her LDL is low and potassium is low. She would like to know if her Vitamin D  is ok?

## 2024-03-16 NOTE — Progress Notes (Signed)
 Pt has been notified.

## 2024-03-16 NOTE — Progress Notes (Signed)
 Called patient and was unable to leave vm to return call

## 2024-03-17 NOTE — Progress Notes (Signed)
 Lab results have been discussed.   Verbalized understanding? Yes  Are there any questions? No

## 2024-03-23 ENCOUNTER — Telehealth: Payer: Self-pay

## 2024-03-23 NOTE — Telephone Encounter (Signed)
 Patient would like to be referred to [facility] clinic. Last visit was 03/14/24    Copied from CRM #[id number]. Topic: General - Other >> Mar 23, 2024  4:12 PM Rosina BIRCH wrote: Reason for CRM: patient called stating she want to be referred to a weight loss clinic because the insurance will not pay for her zepbound  if it is prescribed by MD Tabori  [phone]

## 2024-04-06 ENCOUNTER — Other Ambulatory Visit (HOSPITAL_COMMUNITY): Payer: Self-pay

## 2024-04-06 ENCOUNTER — Telehealth: Payer: Self-pay

## 2024-04-06 MED ORDER — WEGOVY 0.25 MG/0.5ML ~~LOC~~ SOAJ: 0.2500 mg | SUBCUTANEOUS | 1 refills | Status: DC

## 2024-04-06 NOTE — Telephone Encounter (Signed)
 Pharmacy Patient Advocate Encounter  Received notification from [facility] that Prior Authorization for [facility] has been APPROVED from 04/06/24 to 11/04/24. Unable to obtain price due to refill too soon rejection, last fill date 04/06/24 next available fill date10/22/25   PA #/Case ID/Reference #: [id number]

## 2024-04-06 NOTE — Telephone Encounter (Signed)
 Pharmacy Patient Advocate Encounter   Received notification from Onbase that prior authorization for Zepbound  2.5MG /0.5ML pen-injectors  is required/requested.   Insurance verification completed.   The patient is insured through [facility].   Per test claim:  WEGOVY is preferred by the insurance.  If suggested medication is appropriate, Please send in a new RX and discontinue this one. If not, please advise as to why it's not appropriate so that we may request a Prior Authorization. Please note, some preferred medications may still require a PA.  If the suggested medications have not been trialed and there are no contraindications to their use, the PA will not be submitted, as it will not be approved.

## 2024-04-06 NOTE — Telephone Encounter (Signed)
 Clinical questions have been answered and PA submitted. PA currently Pending.  KEY: [id number]

## 2024-04-06 NOTE — Telephone Encounter (Signed)
Prescription for Wegovy sent to pharmacy

## 2024-04-06 NOTE — Telephone Encounter (Signed)
 Patient verbalized understanding but she still wants to be referred to a weight loss clinic

## 2024-04-06 NOTE — Telephone Encounter (Signed)
 Okay to switch to [facility]?

## 2024-04-08 NOTE — Telephone Encounter (Signed)
 This was addressed in another note/MyChart message

## 2024-04-08 NOTE — Telephone Encounter (Signed)
 Called patient and verbalized understanding. Did let patient know it can take a week to hear back about referral.

## 2024-04-08 NOTE — Addendum Note (Signed)
 Addended by: Scotland Dost E on: 04/08/2024 08:11 AM   Modules accepted: Orders

## 2024-04-08 NOTE — Telephone Encounter (Signed)
 Referral placed.

## 2024-04-11 ENCOUNTER — Encounter (INDEPENDENT_AMBULATORY_CARE_PROVIDER_SITE_OTHER): Payer: Self-pay

## 2024-04-21 ENCOUNTER — Ambulatory Visit: Admitting: Family Medicine

## 2024-05-03 ENCOUNTER — Institutional Professional Consult (permissible substitution): Admitting: Bariatrics

## 2024-05-06 ENCOUNTER — Other Ambulatory Visit (HOSPITAL_COMMUNITY): Payer: Self-pay

## 2024-05-12 ENCOUNTER — Institutional Professional Consult (permissible substitution): Admitting: Nurse Practitioner

## 2024-06-08 ENCOUNTER — Other Ambulatory Visit: Payer: Self-pay | Admitting: Family Medicine

## 2024-06-08 DIAGNOSIS — E039 Hypothyroidism, unspecified: Secondary | ICD-10-CM

## 2024-07-04 ENCOUNTER — Ambulatory Visit: Payer: Self-pay

## 2024-07-04 NOTE — Telephone Encounter (Signed)
 Patient has appt to discuss concerns.

## 2024-07-04 NOTE — Telephone Encounter (Signed)
 FYI Only or Action Required?: FYI only for provider: appointment scheduled on 1.7.26.  Patient was last seen in primary care on 03/14/2024 by Mahlon Comer BRAVO, MD.  Called Nurse Triage reporting Palpitations.  Symptoms began yesterday.  Interventions attempted: Nothing.  Symptoms are: gradually improving.  Triage Disposition: See Physician Within 24 Hours, See PCP When Office is Open (Within 3 Days)  Patient/caregiver understands and will follow disposition?: Yes    Copied from CRM #[id number]. Topic: Clinical - Red Word Triage >> Jul 04, 2024  2:29 PM Roselie BROCKS wrote: Red Word that prompted transfer to Nurse Triage: Patient states she has GERD,and Hashimoto's,and patient ate fish which she was told she can't which has caused her severe anxiety and blood pressure to rise. Reason for Disposition  History of hyperthyroidism or taking thyroid  medication  Systolic BP >= 180 OR Diastolic >= 110  Answer Assessment - Initial Assessment Questions Yesterday pt had an event yesterday after eating fish. She states she has hashimotos and gerd. She states after eating the fish her heart rate went up and her BP went up. She called EMS because she didn't know what to do. They did an EKG and told her to get scheduled with her Dr.. She is wanting to see what her heart is doing so a heart scan. And to discuss things she can and cannot eat. Bp while on phone with RN is 186/97, HR 75. She said that made her anxious. RN scheduled appt. No availability until Wednesday. RN gave instructions on when to go the ER which she said made her more nervous. RN advised we give extra information for worst case scenarios so she can be prepared. Pt rechecked BP after RN advised her not to and it was 179/110. RN did education on checking BP. RN advised to relax about 5 minutes with feet flat on the floor, not crossed. RN did educate on taking BPS back to back and also if the cuff does not fit appropriately it can give falsely  elevated bps. RN also advised taking it while nervous can increase it. RN advised pt to keep log of Bps, 2x a day with the above instructions. RN advised pt to call back with any changes. Pt stated understanding.        1. DESCRIPTION: Please describe your heart rate or heartbeat that you are having (e.g., fast/slow, regular/irregular, skipped or extra beats, palpitations)     Fast about 100 2. ONSET: When did it start? (e.g., minutes, hours, days)      Last night 3. DURATION: How long does it last (e.g., seconds, minutes, hours)      4. PATTERN Does it come and go, or has it been constant since it started?  Does it get worse with exertion?   Are you feeling it now?     Denies feeling it currently 5. TAP: Using your hand, can you tap out what you are feeling on a chair or table in front of you, so that I can hear? Note: Not all patients can do this.        6. HEART RATE: Can you tell me your heart rate? How many beats in 15 seconds?  Note: Not all patients can do this.       75 currently 7. RECURRENT SYMPTOM: Have you ever had this before? If Yes, ask: When was the last time? and What happened that time?       8. CAUSE: What do you think is causing the palpitations?  Pt thinks it was from eating fish 9. CARDIAC HISTORY: Do you have any history of heart disease? (e.g., heart attack, angina, bypass surgery, angioplasty, arrhythmia)      htn 10. OTHER SYMPTOMS: Do you have any other symptoms? (e.g., dizziness, chest pain, sweating, difficulty breathing)       Denies any symptoms currently  Answer Assessment - Initial Assessment Questions Pt denies any symptoms currently. Checking BP made pt more anxious, RN provided lots of education.     1. BLOOD PRESSURE: What is your blood pressure? Did you take at least two measurements 5 minutes apart?     186/97 2. ONSET: When did you take your blood pressure?     While on phone with RN 3. HOW: How did  you take your blood pressure? (e.g., automatic home BP monitor, visiting nurse)     Automatic cuff 4. HISTORY: Do you have a history of high blood pressure?     yes 5. MEDICINES: Are you taking any medicines for blood pressure? Have you missed any doses recently?     She states she may have missed a dose last week, is no longer taking water pill 6. OTHER SYMPTOMS: Do you have any symptoms? (e.g., blurred vision, chest pain, difficulty breathing, headache, weakness)     She was having palpitations last nigh.  Protocols used: Heart Rate and Heartbeat Questions-A-AH, Blood Pressure - High-A-AH

## 2024-07-06 ENCOUNTER — Ambulatory Visit: Admitting: Family Medicine

## 2024-07-20 ENCOUNTER — Other Ambulatory Visit (HOSPITAL_COMMUNITY): Payer: Self-pay

## 2024-07-25 ENCOUNTER — Ambulatory Visit: Admitting: Cardiology

## 2024-07-29 ENCOUNTER — Ambulatory Visit: Admitting: Cardiology

## 2024-08-03 ENCOUNTER — Ambulatory Visit: Admitting: Cardiology

## 2024-08-03 ENCOUNTER — Encounter: Payer: Self-pay | Admitting: Cardiology

## 2024-08-03 VITALS — BP 134/84 | HR 68 | Ht 68.0 in | Wt 315.8 lb

## 2024-08-03 DIAGNOSIS — R002 Palpitations: Secondary | ICD-10-CM

## 2024-08-03 MED ORDER — PROPRANOLOL HCL 20 MG PO TABS: 20.0000 mg | ORAL_TABLET | Freq: Two times a day (BID) | ORAL | 3 refills | Status: AC

## 2024-08-03 NOTE — Patient Instructions (Addendum)
 Medication Instructions:  Stop Metoprolol Succinate Start Propranolol  20 mg twice daily *If you need a refill on your cardiac medications before your next appointment, please call your pharmacy*  Lab Work: NONE If you have labs (blood work) drawn today and your tests are completely normal, you will receive your results only by: MyChart Message (if you have MyChart) OR A paper copy in the mail If you have any lab test that is abnormal or we need to change your treatment, we will call you to review the results.  Testing/Procedures: 4 Week Event Monitor Your physician has recommended that you wear an event monitor. Event monitors are medical devices that record the hearts electrical activity. Doctors most often us  these monitors to diagnose arrhythmias. Arrhythmias are problems with the speed or rhythm of the heartbeat. The monitor is a small, portable device. You can wear one while you do your normal daily activities. This is usually used to diagnose what is causing palpitations/syncope (passing out).   Follow-Up: At [facility], you and your health needs are our priority.  As part of our continuing mission to provide you with exceptional heart care, our providers are all part of one team.  This team includes your primary Cardiologist (physician) and Advanced Practice Providers or APPs (Physician Assistants and Nurse Practitioners) who all work together to provide you with the care you need, when you need it.  Your next appointment:   2 months  Provider:   One of our Advanced Practice Providers (APPs): Morse Clause, PA-C  Hanh Waddell Daniels, PA-C  Saddie Cleaves, NP  Olivia Pavy, PA-C Miriam Shams, NP  Leontine Salen, PA-C Josefa Beauvais, NP  [facility] PA-C [place], PA-C  [place], PA-C [place], [place]  Damien Braver, NP Jon Hails, PA-C  Waddell Donath, PA-C Dayna Dunn, PA-C  [place], PA-C [place], [place] Glendia Ferrier, PA-C Callie Goodrich,  PA-C  Katlyn West, NP Thom Sluder, PA-C  Alyssa White, NP Rollo Louder, PA-C Xika Zhao, NP    Lamarr Satterfield, NP        We recommend signing up for the patient portal called MyChart.  Sign up information is provided on this After Visit Summary.  MyChart is used to connect with patients for Virtual Visits (Telemedicine).  Patients are able to view lab/test results, encounter notes, upcoming appointments, etc.  Non-urgent messages can be sent to your provider as well.   To learn more about what you can do with MyChart, go to [email or web address].   Other Instructions  Blood pressure diary: take your blood pressure twice daily for 10 days and send us  the readings via MyChart.

## 2024-10-03 ENCOUNTER — Ambulatory Visit: Admitting: Emergency Medicine
